# Patient Record
Sex: Male | Born: 1940 | Race: White | Hispanic: No | Marital: Married | Smoking: Former smoker
Health system: Southern US, Community
[De-identification: ages and names within clinical notes are randomized; demographics above are authoritative.]

## PROBLEM LIST (undated history)

## (undated) DIAGNOSIS — J449 Chronic obstructive pulmonary disease, unspecified: Secondary | ICD-10-CM

## (undated) DIAGNOSIS — J441 Chronic obstructive pulmonary disease with (acute) exacerbation: Secondary | ICD-10-CM

## (undated) DIAGNOSIS — E785 Hyperlipidemia, unspecified: Secondary | ICD-10-CM

## (undated) DIAGNOSIS — Z1211 Encounter for screening for malignant neoplasm of colon: Secondary | ICD-10-CM

## (undated) DIAGNOSIS — J302 Other seasonal allergic rhinitis: Secondary | ICD-10-CM

## (undated) DIAGNOSIS — N400 Enlarged prostate without lower urinary tract symptoms: Secondary | ICD-10-CM

## (undated) DIAGNOSIS — I10 Essential (primary) hypertension: Secondary | ICD-10-CM

## (undated) HISTORY — DX: Hyperlipidemia, unspecified: E78.5

## (undated) HISTORY — DX: Other seasonal allergic rhinitis: J30.2

## (undated) HISTORY — DX: Chronic obstructive pulmonary disease with (acute) exacerbation: J44.1

## (undated) HISTORY — DX: Benign prostatic hyperplasia without lower urinary tract symptoms: N40.0

## (undated) HISTORY — DX: Chronic obstructive pulmonary disease, unspecified: J44.9

## (undated) HISTORY — PX: PROSTATE SURGERY: SHX751

## (undated) HISTORY — DX: Essential (primary) hypertension: I10

## (undated) HISTORY — DX: Encounter for screening for malignant neoplasm of colon: Z12.11

---

## 1971-03-06 HISTORY — PX: GASTRIC RESECTION: SHX5248

## 2009-03-05 DIAGNOSIS — Z1211 Encounter for screening for malignant neoplasm of colon: Secondary | ICD-10-CM

## 2009-03-05 HISTORY — DX: Encounter for screening for malignant neoplasm of colon: Z12.11

## 2009-07-11 ENCOUNTER — Ambulatory Visit: Payer: Self-pay | Admitting: Gastroenterology

## 2010-05-30 LAB — BASIC METABOLIC PANEL
BUN: 21 mg/dL (ref 4–21)
Creatinine: 1.2 mg/dL (ref 0.6–1.3)
Potassium: 4.1 mmol/L (ref 3.4–5.3)
Sodium: 137 mmol/L (ref 137–147)

## 2010-05-30 LAB — TSH: TSH: 0.04 u[IU]/mL — AB (ref 0.41–5.90)

## 2010-05-30 LAB — LIPID PANEL: Triglycerides: 69 mg/dL (ref 40–160)

## 2010-05-30 LAB — CBC AND DIFFERENTIAL: Hemoglobin: 13.9 g/dL (ref 13.5–17.5)

## 2010-05-30 LAB — HEPATIC FUNCTION PANEL: Bilirubin, Total: 0.4 mg/dL

## 2010-11-30 ENCOUNTER — Encounter: Payer: Self-pay | Admitting: Internal Medicine

## 2010-11-30 ENCOUNTER — Ambulatory Visit (INDEPENDENT_AMBULATORY_CARE_PROVIDER_SITE_OTHER): Payer: Medicare Other | Admitting: Internal Medicine

## 2010-11-30 VITALS — BP 140/83 | HR 80 | Temp 98.4°F | Resp 16 | Ht 63.0 in | Wt 185.5 lb

## 2010-11-30 DIAGNOSIS — Z0000 Encounter for general adult medical examination without abnormal findings: Secondary | ICD-10-CM

## 2010-11-30 DIAGNOSIS — J40 Bronchitis, not specified as acute or chronic: Secondary | ICD-10-CM

## 2010-11-30 DIAGNOSIS — N400 Enlarged prostate without lower urinary tract symptoms: Secondary | ICD-10-CM

## 2010-11-30 DIAGNOSIS — E785 Hyperlipidemia, unspecified: Secondary | ICD-10-CM

## 2010-11-30 DIAGNOSIS — Z23 Encounter for immunization: Secondary | ICD-10-CM

## 2010-11-30 DIAGNOSIS — I10 Essential (primary) hypertension: Secondary | ICD-10-CM

## 2010-11-30 HISTORY — DX: Benign prostatic hyperplasia without lower urinary tract symptoms: N40.0

## 2010-11-30 MED ORDER — AZITHROMYCIN 250 MG PO TABS: ORAL_TABLET | ORAL | Status: AC

## 2010-11-30 MED ORDER — FINASTERIDE 5 MG PO TABS: 5.0000 mg | ORAL_TABLET | Freq: Every day | ORAL | Status: DC

## 2010-11-30 NOTE — Patient Instructions (Signed)
Start Zpack. Call if cough persists or worsens. Return visit in 6 months.

## 2010-11-30 NOTE — Progress Notes (Signed)
Subjective:    Patient ID: Lucas Black, male    DOB: Mar 15, 1940, 70 y.o.   MRN: [id number]  HPI Lucas Black is a 70 year old male with a history of hypertension and hyperlipidemia who presents for followup. He reports that he has been doing well his only complaint today is a 2 week history of nasal congestion and cough. He denies any fever or chills. He denies any shortness of breath. He reports that the cough is occasionally productive of yellow sputum. He has not had any known sick contacts.  Outpatient Encounter Prescriptions as of 11/30/2010  Medication Sig Dispense Refill  . azithromycin (ZITHROMAX Z-PAK) 250 MG tablet Take 2 tablets (500 mg) on  Day 1,  followed by 1 tablet (250 mg) once daily on Days 2 through 5.  6 each  0  . finasteride (PROSCAR) 5 MG tablet Take 1 tablet (5 mg total) by mouth daily.  90 tablet  4  . lisinopril (PRINIVIL,ZESTRIL) 10 MG tablet Take 10 mg by mouth daily.        . simvastatin (ZOCOR) 20 MG tablet Take 20 mg by mouth at bedtime.        Marland Kitchen terazosin (HYTRIN) 5 MG capsule Take 5 mg by mouth at bedtime. Takes 2 capsules at bedtime         Review of Systems  Constitutional: Negative for fever, chills, activity change and fatigue.  HENT: Positive for congestion, rhinorrhea and postnasal drip. Negative for hearing loss, ear pain, nosebleeds, sore throat, sneezing, trouble swallowing, neck pain, neck stiffness, voice change, sinus pressure, tinnitus and ear discharge.   Eyes: Negative for discharge, redness, itching and visual disturbance.  Respiratory: Positive for cough. Negative for chest tightness, shortness of breath, wheezing and stridor.   Cardiovascular: Negative for chest pain and leg swelling.  Musculoskeletal: Negative for myalgias and arthralgias.  Skin: Negative for color change and rash.  Neurological: Negative for dizziness, facial asymmetry and headaches.  Psychiatric/Behavioral: Negative for sleep disturbance.   BP 140/83  Pulse 80  Temp(Src)  98.4 F (36.9 C) (Oral)  Resp 16  Ht 5\' 3"  (1.6 m)  Wt 185 lb 8 oz (84.142 kg)  BMI 32.86 kg/m2  SpO2 96%     Objective:   Physical Exam  Constitutional: He is oriented to person, place, and time. He appears well-developed and well-nourished. No distress.  HENT:  Head: Normocephalic and atraumatic.  Right Ear: Tympanic membrane, external ear and ear canal normal.  Left Ear: External ear and ear canal normal. Tympanic membrane is erythematous.  Nose: Nose normal. Right sinus exhibits no maxillary sinus tenderness and no frontal sinus tenderness. Left sinus exhibits no maxillary sinus tenderness and no frontal sinus tenderness.  Mouth/Throat: Oropharynx is clear and moist. No oropharyngeal exudate, posterior oropharyngeal edema or posterior oropharyngeal erythema.  Eyes: Conjunctivae and EOM are normal. Pupils are equal, round, and reactive to light. Right eye exhibits no discharge. Left eye exhibits no discharge. No scleral icterus.  Neck: Normal range of motion. Neck supple. No tracheal deviation present. No thyromegaly present.  Cardiovascular: Normal rate, regular rhythm and normal heart sounds.  Exam reveals no gallop and no friction rub.   No murmur heard. Pulmonary/Chest: Effort normal and breath sounds normal. No respiratory distress. He has no wheezes. He has no rales. He exhibits no tenderness.  Musculoskeletal: Normal range of motion. He exhibits no edema.  Lymphadenopathy:    He has no cervical adenopathy.  Neurological: He is alert and oriented to person, place, and  time. No cranial nerve deficit. Coordination normal.  Skin: Skin is warm and dry. No rash noted. He is not diaphoretic. No erythema. No pallor.  Psychiatric: He has a normal mood and affect. His behavior is normal. Judgment and thought content normal.          Assessment & Plan:  1. Bronchitis - patients symptoms and clinical exam consistent with bronchitis. There is no wheezing or prolonged expiration to  suggest need for steroids. Will treat with azithromycin. He will call or return to clinic should symptoms not continue to improve over the next 48 hours.  2. Hypertension - patient with history of hypertension. Blood pressure is slightly elevated today. His dose of lisinopril was reduced by his cardiologist because of some lightheadedness at higher doses. We'll continue to monitor blood pressure for now. He will have renal function and urine microalbumin check labs today.  3. Hyperlipidemia - patient with history of hyperlipidemia. Currently on simvastatin. We'll check lipids and LFTs with labs today. He will followup in 6 months.

## 2010-12-01 ENCOUNTER — Encounter: Payer: Self-pay | Admitting: Internal Medicine

## 2010-12-01 LAB — COMPREHENSIVE METABOLIC PANEL
ALT: 15 U/L (ref 0–53)
AST: 19 U/L (ref 0–37)
Alkaline Phosphatase: 81 U/L (ref 39–117)
BUN: 19 mg/dL (ref 6–23)
Calcium: 9.7 mg/dL (ref 8.4–10.5)
Chloride: 109 mEq/L (ref 96–112)
Creatinine, Ser: 1.2 mg/dL (ref 0.4–1.5)
Total Bilirubin: 0.5 mg/dL (ref 0.3–1.2)

## 2010-12-01 LAB — LIPID PANEL
Cholesterol: 139 mg/dL (ref 0–200)
LDL Cholesterol: 73 mg/dL (ref 0–99)
Total CHOL/HDL Ratio: 3
Triglycerides: 72 mg/dL (ref 0.0–149.0)
VLDL: 14.4 mg/dL (ref 0.0–40.0)

## 2010-12-01 LAB — CBC
HCT: 41.3 % (ref 39.0–52.0)
Hemoglobin: 13.7 g/dL (ref 13.0–17.0)
MCH: 29.4 pg (ref 26.0–34.0)
MCHC: 33.2 g/dL (ref 30.0–36.0)
RBC: 4.66 MIL/uL (ref 4.22–5.81)

## 2010-12-06 LAB — HM COLONOSCOPY

## 2011-01-23 ENCOUNTER — Encounter: Payer: Self-pay | Admitting: Internal Medicine

## 2011-03-28 ENCOUNTER — Encounter: Payer: Self-pay | Admitting: Internal Medicine

## 2011-03-28 ENCOUNTER — Ambulatory Visit (INDEPENDENT_AMBULATORY_CARE_PROVIDER_SITE_OTHER): Payer: Medicare Other | Admitting: Internal Medicine

## 2011-03-28 VITALS — BP 110/60 | HR 84 | Temp 98.4°F | Ht 66.0 in | Wt 187.0 lb

## 2011-03-28 DIAGNOSIS — R062 Wheezing: Secondary | ICD-10-CM

## 2011-03-28 DIAGNOSIS — J40 Bronchitis, not specified as acute or chronic: Secondary | ICD-10-CM | POA: Diagnosis not present

## 2011-03-28 DIAGNOSIS — J440 Chronic obstructive pulmonary disease with acute lower respiratory infection: Secondary | ICD-10-CM | POA: Insufficient documentation

## 2011-03-28 MED ORDER — ALBUTEROL SULFATE HFA 108 (90 BASE) MCG/ACT IN AERS: 2.0000 | INHALATION_SPRAY | Freq: Four times a day (QID) | RESPIRATORY_TRACT | Status: DC | PRN

## 2011-03-28 MED ORDER — PREDNISONE (PAK) 10 MG PO TABS: ORAL_TABLET | ORAL | Status: AC

## 2011-03-28 MED ORDER — DOXYCYCLINE HYCLATE 100 MG PO TABS: 100.0000 mg | ORAL_TABLET | Freq: Two times a day (BID) | ORAL | Status: AC

## 2011-03-28 NOTE — Progress Notes (Signed)
Subjective:    Patient ID: Lucas Black, male    DOB: Feb 08, 1941, 71 y.o.   MRN: [id number]  HPI 71 year old male with a history of tobacco abuse presents for an acute visit complaining of several weeks of cough productive of purulent sputum, shortness of breath, and wheezing. He has been out of his albuterol inhaler and has not been using this. He has been using over-the-counter cough and cold preparations with no improvement in his symptoms. He denies any fever or chills. He denies any chest pain.  Outpatient Encounter Prescriptions as of 03/28/2011  Medication Sig Dispense Refill  . finasteride (PROSCAR) 5 MG tablet Take 1 tablet (5 mg total) by mouth daily.  90 tablet  4  . lisinopril (PRINIVIL,ZESTRIL) 10 MG tablet Take 10 mg by mouth daily.        . simvastatin (ZOCOR) 20 MG tablet Take 20 mg by mouth at bedtime.        Marland Kitchen terazosin (HYTRIN) 5 MG capsule Take 5 mg by mouth at bedtime. Takes 2 capsules at bedtime       . albuterol (PROVENTIL HFA;VENTOLIN HFA) 108 (90 BASE) MCG/ACT inhaler Inhale 2 puffs into the lungs every 6 (six) hours as needed for wheezing.  1 Inhaler  0  . doxycycline (VIBRA-TABS) 100 MG tablet Take 1 tablet (100 mg total) by mouth 2 (two) times daily.  20 tablet  0  . predniSONE (STERAPRED UNI-PAK) 10 MG tablet Take 60mg  day 1 then taper by 10mg  daily  21 tablet  0    Review of Systems  Constitutional: Negative for fever, chills, activity change and fatigue.  HENT: Negative for hearing loss, ear pain, nosebleeds, congestion, sore throat, rhinorrhea, sneezing, trouble swallowing, neck pain, neck stiffness, voice change, postnasal drip, sinus pressure, tinnitus and ear discharge.   Eyes: Negative for discharge, redness, itching and visual disturbance.  Respiratory: Positive for cough, shortness of breath and wheezing. Negative for chest tightness and stridor.   Cardiovascular: Negative for chest pain and leg swelling.  Musculoskeletal: Negative for myalgias and  arthralgias.  Skin: Negative for color change and rash.  Neurological: Negative for dizziness, facial asymmetry and headaches.  Psychiatric/Behavioral: Negative for sleep disturbance.   BP 110/60  Pulse 84  Temp(Src) 98.4 F (36.9 C) (Oral)  Ht 5\' 6"  (1.676 m)  Wt 187 lb (84.823 kg)  BMI 30.18 kg/m2  SpO2 96%     Objective:   Physical Exam  Constitutional: He is oriented to person, place, and time. He appears well-developed and well-nourished. No distress.  HENT:  Head: Normocephalic and atraumatic.  Right Ear: External ear normal.  Left Ear: External ear normal.  Nose: Nose normal.  Mouth/Throat: Oropharynx is clear and moist. No oropharyngeal exudate.  Eyes: Conjunctivae and EOM are normal. Pupils are equal, round, and reactive to light. Right eye exhibits no discharge. Left eye exhibits no discharge. No scleral icterus.  Neck: Normal range of motion. Neck supple. No tracheal deviation present. No thyromegaly present.  Cardiovascular: Normal rate, regular rhythm and normal heart sounds.  Exam reveals no gallop and no friction rub.   No murmur heard. Pulmonary/Chest: Effort normal. No accessory muscle usage. Not tachypneic. No respiratory distress. He has decreased breath sounds (prolonged exp phase). He has wheezes. He has no rales. He exhibits no tenderness.  Musculoskeletal: Normal range of motion. He exhibits no edema.  Lymphadenopathy:    He has no cervical adenopathy.  Neurological: He is alert and oriented to person, place, and time. No  cranial nerve deficit. Coordination normal.  Skin: Skin is warm and dry. No rash noted. He is not diaphoretic. No erythema. No pallor.  Psychiatric: He has a normal mood and affect. His behavior is normal. Judgment and thought content normal.          Assessment & Plan:

## 2011-03-28 NOTE — Assessment & Plan Note (Signed)
Symptoms and exam are most consistent with acute bronchitis. Will treat with doxycycline, prednisone taper pack, and inhaled albuterol. Patient will continue to use cough syrup that he has at home. He will call or return to clinic if symptoms are not improving over the next 72 hours.

## 2011-04-11 ENCOUNTER — Telehealth: Payer: Self-pay | Admitting: Internal Medicine

## 2011-04-11 ENCOUNTER — Ambulatory Visit: Payer: Self-pay | Admitting: Internal Medicine

## 2011-04-11 ENCOUNTER — Encounter: Payer: Self-pay | Admitting: Internal Medicine

## 2011-04-11 ENCOUNTER — Ambulatory Visit (INDEPENDENT_AMBULATORY_CARE_PROVIDER_SITE_OTHER): Payer: Medicare Other | Admitting: Internal Medicine

## 2011-04-11 VITALS — BP 102/62 | HR 78 | Temp 97.9°F | Ht 66.0 in | Wt 188.0 lb

## 2011-04-11 DIAGNOSIS — R062 Wheezing: Secondary | ICD-10-CM | POA: Diagnosis not present

## 2011-04-11 DIAGNOSIS — J209 Acute bronchitis, unspecified: Secondary | ICD-10-CM

## 2011-04-11 DIAGNOSIS — J40 Bronchitis, not specified as acute or chronic: Secondary | ICD-10-CM

## 2011-04-11 DIAGNOSIS — J440 Chronic obstructive pulmonary disease with acute lower respiratory infection: Secondary | ICD-10-CM | POA: Diagnosis not present

## 2011-04-11 DIAGNOSIS — R059 Cough, unspecified: Secondary | ICD-10-CM | POA: Diagnosis not present

## 2011-04-11 MED ORDER — FLUTICASONE-SALMETEROL 250-50 MCG/DOSE IN AEPB: 1.0000 | INHALATION_SPRAY | Freq: Two times a day (BID) | RESPIRATORY_TRACT | Status: DC

## 2011-04-11 MED ORDER — LEVOFLOXACIN 750 MG PO TABS: 750.0000 mg | ORAL_TABLET | Freq: Every day | ORAL | Status: DC

## 2011-04-11 MED ORDER — PREDNISONE (PAK) 10 MG PO TABS: ORAL_TABLET | ORAL | Status: DC

## 2011-04-11 MED ORDER — METHYLPREDNISOLONE ACETATE PF 40 MG/ML IJ SUSP
40.0000 mg | Freq: Once | INTRAMUSCULAR | Status: AC
  Administered 2011-04-11: 40 mg via INTRAMUSCULAR

## 2011-04-11 NOTE — Assessment & Plan Note (Signed)
Symptoms are most consistent with COPD with acute bronchitis. Will repeat antibiotics and prednisone taper. Will get chest x-ray today. Will give injection of 40 mg of Depo-Medrol. Patient will also start Advair inhaled twice daily. He'll continue to use albuterol as needed. He will followup for recheck in one week. He will call sooner if symptoms are not improving.

## 2011-04-11 NOTE — Progress Notes (Signed)
Subjective:    Patient ID: Lucas Black, male    DOB: 1940/12/02, 71 y.o.   MRN: [id number]  HPI 71 year old male presents for followup visit after recent episode of bronchitis. He was initially treated with doxycycline and prednisone and notes some improvement with this, however he continues to have wheezing, shortness of breath, and cough productive of thick sputum. He denies any chest pain. He notes some fatigue. He denies any fever or chills. Of note, he is a former smoker. He has never been diagnosed with COPD, however has had several episodes of bronchitis each year.  Outpatient Encounter Prescriptions as of 04/11/2011  Medication Sig Dispense Refill  . albuterol (PROVENTIL HFA;VENTOLIN HFA) 108 (90 BASE) MCG/ACT inhaler Inhale 2 puffs into the lungs every 6 (six) hours as needed for wheezing.  1 Inhaler  0  . finasteride (PROSCAR) 5 MG tablet Take 1 tablet (5 mg total) by mouth daily.  90 tablet  4  . lisinopril (PRINIVIL,ZESTRIL) 10 MG tablet Take 10 mg by mouth daily.        . simvastatin (ZOCOR) 20 MG tablet Take 20 mg by mouth at bedtime.        Marland Kitchen terazosin (HYTRIN) 5 MG capsule Take 5 mg by mouth at bedtime. Takes 2 capsules at bedtime       . Fluticasone-Salmeterol (ADVAIR DISKUS) 250-50 MCG/DOSE AEPB Inhale 1 puff into the lungs 2 (two) times daily.  1 each  3  . levofloxacin (LEVAQUIN) 750 MG tablet Take 1 tablet (750 mg total) by mouth daily.  7 tablet  0  . predniSONE (STERAPRED UNI-PAK) 10 MG tablet Take 60mg  day 1 then taper by 10mg  daily  21 tablet  0   Facility-Administered Encounter Medications as of 04/11/2011  Medication Dose Route Frequency Provider Last Rate Last Dose  . methylPREDNISolone acetate PF (DEPO-MEDROL) injection 40 mg  40 mg Intramuscular Once Shelia Media, MD   40 mg at 04/11/11 1301    Review of Systems  Constitutional: Positive for fatigue. Negative for fever, chills and activity change.  HENT: Negative for hearing loss, ear pain, nosebleeds,  congestion, sore throat, rhinorrhea, sneezing, trouble swallowing, neck pain, neck stiffness, voice change, postnasal drip, sinus pressure, tinnitus and ear discharge.   Eyes: Negative for discharge, redness, itching and visual disturbance.  Respiratory: Positive for cough, shortness of breath and wheezing. Negative for chest tightness and stridor.   Cardiovascular: Negative for chest pain and leg swelling.  Musculoskeletal: Negative for myalgias and arthralgias.  Skin: Negative for color change and rash.  Neurological: Negative for dizziness, facial asymmetry and headaches.  Psychiatric/Behavioral: Negative for sleep disturbance.   BP 102/62  Pulse 78  Temp(Src) 97.9 F (36.6 C) (Oral)  Ht 5\' 6"  (1.676 m)  Wt 188 lb (85.276 kg)  BMI 30.34 kg/m2  SpO2 97%     Objective:   Physical Exam  Constitutional: He is oriented to person, place, and time. He appears well-developed and well-nourished. No distress.  HENT:  Head: Normocephalic and atraumatic.  Right Ear: External ear normal.  Left Ear: External ear normal.  Nose: Nose normal.  Mouth/Throat: Oropharynx is clear and moist. No oropharyngeal exudate.  Eyes: Conjunctivae and EOM are normal. Pupils are equal, round, and reactive to light. Right eye exhibits no discharge. Left eye exhibits no discharge. No scleral icterus.  Neck: Normal range of motion. Neck supple. No tracheal deviation present. No thyromegaly present.  Cardiovascular: Normal rate, regular rhythm and normal heart sounds.  Exam reveals  no gallop and no friction rub.   No murmur heard. Pulmonary/Chest: Effort normal. No accessory muscle usage. Not tachypneic. No respiratory distress. He has decreased breath sounds. He has wheezes. He has rhonchi in the right lower field. He has no rales. He exhibits no tenderness.  Musculoskeletal: Normal range of motion. He exhibits no edema.  Lymphadenopathy:    He has no cervical adenopathy.  Neurological: He is alert and oriented  to person, place, and time. No cranial nerve deficit. Coordination normal.  Skin: Skin is warm and dry. No rash noted. He is not diaphoretic. No erythema. No pallor.  Psychiatric: He has a normal mood and affect. His behavior is normal. Judgment and thought content normal.          Assessment & Plan:

## 2011-04-11 NOTE — Telephone Encounter (Signed)
CXR was normal. Continue antibiotics and steroids as prescribed.

## 2011-04-11 NOTE — Telephone Encounter (Signed)
[facility] Triage Call Report Triage Record Num: [id number] Operator: Chevis Pretty Patient Name: Lucas Black Call Date & Time: 04/11/2011 8:46:04AM Patient Phone: [phone] PCP: Patient Gender: Male PCP Fax : Patient DOB: 12-04-1940 Practice Name: [facility] Reason for Call: Caller: Carol/Spouse; PCP: Ronna Polio; CB#: [phone]; ; ; Call regarding Cough/Congestion; onset 2 weeks ago. Seen in office and placed on prednisone, antibiotics. Finished that. Continues to have cough and wheezing. Using inhaler BID. Coughing is worse. Has not used albuterol q 4 hours or for rescue/cough control. Per protocol, advised appt wihtin 4 hours; appt sched 04/11/11 1115 with Dr. Dan Humphreys. Protocol(s) Used: Asthma - Adult Recommended Outcome per Protocol: See Provider within 4 hours Reason for Outcome: New onset or worsening cough AND asthma with increasing frequency of flare-ups since last scheduled appointment Care Advice: ~ Another adult should drive. Limit or avoid exposure to irritants and allergens (e.g. air pollution, smoke/smoking, chemicals, dust, pollen, pet dander, etc.) ~ ~ Call provider if symptoms worsen or new symptoms develop. If patient has had similar symptoms in past, relieved by provider's recommendations, follow those recommendations now. ~ ~ Avoid any activity that produces symptoms until evaluated by provider. Call EMS 911 if sudden worsening of breathing problems, struggling to breathe, high pitched noise when breathing in (stridor), unable to speak, grasping at throat, or panic/anxiety because of breathing problems. ~ ~ Use prescribed rescue medication (inhaler, nebulizer) as directed. ~ SYMPTOM / CONDITION MANAGEMENT ~ CAUTIONS ~ List, or take, all current prescription(s), nonprescription or alternative medication(s) to provider for evaluation. 04/11/2011 8:55:44AM Page 1 of 1 [email or web address]

## 2011-04-26 ENCOUNTER — Ambulatory Visit (INDEPENDENT_AMBULATORY_CARE_PROVIDER_SITE_OTHER): Payer: Medicare Other | Admitting: Internal Medicine

## 2011-04-26 ENCOUNTER — Encounter: Payer: Self-pay | Admitting: Internal Medicine

## 2011-04-26 VITALS — HR 69 | Temp 98.2°F | Resp 10

## 2011-04-26 DIAGNOSIS — E785 Hyperlipidemia, unspecified: Secondary | ICD-10-CM | POA: Diagnosis not present

## 2011-04-26 DIAGNOSIS — J441 Chronic obstructive pulmonary disease with (acute) exacerbation: Secondary | ICD-10-CM

## 2011-04-26 DIAGNOSIS — K259 Gastric ulcer, unspecified as acute or chronic, without hemorrhage or perforation: Secondary | ICD-10-CM | POA: Insufficient documentation

## 2011-04-26 MED ORDER — BUDESONIDE-FORMOTEROL FUMARATE 160-4.5 MCG/ACT IN AERO: 2.0000 | INHALATION_SPRAY | Freq: Two times a day (BID) | RESPIRATORY_TRACT | Status: DC

## 2011-04-26 MED ORDER — PREDNISONE 10 MG PO TABS: ORAL_TABLET | ORAL | Status: DC

## 2011-04-26 MED ORDER — SIMVASTATIN 20 MG PO TABS: 20.0000 mg | ORAL_TABLET | Freq: Every day | ORAL | Status: DC

## 2011-04-26 NOTE — Assessment & Plan Note (Signed)
Symptoms consistent with COPD exacerbation. Bronchitis initially improved with use of prednisone taper and antibiotics, however have recurred after he stopped inhaled bronchodilator and steroids. Provided him samples with Symbicort. We'll continue to use albuterol as needed. Will repeat prednisone taper, but prolong taper this time. He will followup in 2 weeks.

## 2011-04-26 NOTE — Progress Notes (Signed)
Subjective:    Patient ID: Lucas Black, male    DOB: 12-21-1940, 71 y.o.   MRN: [id number]  HPI 70YO male with h/o COPD presents for follow up.  He was recently treated with antibiotics and prednisone taper x2 for acute bronchitis. CXR performed 04/11/2011 was normal.  He was also started on inhaled Advair but reports he ran out of this and stopped medication. He notes that symptoms initially improved, but wheezing and shortness of breath have recurred and been persistent. He denies any productive cough. He denies any fever or chills. He denies any chest pain.  Outpatient Encounter Prescriptions as of 04/26/2011  Medication Sig Dispense Refill  . albuterol (PROVENTIL HFA;VENTOLIN HFA) 108 (90 BASE) MCG/ACT inhaler Inhale 2 puffs into the lungs every 6 (six) hours as needed for wheezing.  1 Inhaler  0  . budesonide-formoterol (SYMBICORT) 160-4.5 MCG/ACT inhaler Inhale 2 puffs into the lungs 2 (two) times daily.  1 Inhaler  3  . finasteride (PROSCAR) 5 MG tablet Take 1 tablet (5 mg total) by mouth daily.  90 tablet  4  . lisinopril (PRINIVIL,ZESTRIL) 10 MG tablet Take 10 mg by mouth daily.        . predniSONE (DELTASONE) 10 MG tablet Take 40mg  daily x 3 days, then 30mg  daily x3 days, then 20mg  daily x 3 days, then 10mg  daily x 3 days, then stop.  30 tablet  0  . simvastatin (ZOCOR) 20 MG tablet Take 1 tablet (20 mg total) by mouth at bedtime.  30 tablet  6  . terazosin (HYTRIN) 5 MG capsule Take 5 mg by mouth at bedtime. Takes 2 capsules at bedtime       . DISCONTD: Fluticasone-Salmeterol (ADVAIR DISKUS) 250-50 MCG/DOSE AEPB Inhale 1 puff into the lungs 2 (two) times daily.  1 each  3  . DISCONTD: simvastatin (ZOCOR) 20 MG tablet Take 20 mg by mouth at bedtime.         Review of Systems  Constitutional: Negative for fever, chills, activity change and fatigue.  HENT: Negative for hearing loss, ear pain, nosebleeds, congestion, sore throat, rhinorrhea, sneezing, trouble swallowing, neck pain, neck  stiffness, voice change, postnasal drip, sinus pressure, tinnitus and ear discharge.   Eyes: Negative for discharge, redness, itching and visual disturbance.  Respiratory: Positive for cough, shortness of breath and wheezing. Negative for chest tightness and stridor.   Cardiovascular: Negative for chest pain and leg swelling.  Musculoskeletal: Negative for myalgias and arthralgias.  Skin: Negative for color change and rash.  Neurological: Negative for dizziness, facial asymmetry and headaches.  Psychiatric/Behavioral: Negative for sleep disturbance.   Pulse 69  Temp 98.2 F (36.8 C)  Resp 10  SpO2 97%     Objective:   Physical Exam  Constitutional: He is oriented to person, place, and time. He appears well-developed and well-nourished. No distress.  HENT:  Head: Normocephalic and atraumatic.  Right Ear: External ear normal.  Left Ear: External ear normal.  Nose: Nose normal.  Mouth/Throat: Oropharynx is clear and moist. No oropharyngeal exudate.  Eyes: Conjunctivae and EOM are normal. Pupils are equal, round, and reactive to light. Right eye exhibits no discharge. Left eye exhibits no discharge. No scleral icterus.  Neck: Normal range of motion. Neck supple. No tracheal deviation present. No thyromegaly present.  Cardiovascular: Normal rate, regular rhythm and normal heart sounds.  Exam reveals no gallop and no friction rub.   No murmur heard. Pulmonary/Chest: Effort normal. No accessory muscle usage. Not tachypneic. No respiratory  distress. He has decreased breath sounds (prolonged expiration). He has wheezes. He has no rhonchi. He has no rales. He exhibits no tenderness.  Musculoskeletal: Normal range of motion. He exhibits no edema.  Lymphadenopathy:    He has no cervical adenopathy.  Neurological: He is alert and oriented to person, place, and time. No cranial nerve deficit. Coordination normal.  Skin: Skin is warm and dry. No rash noted. He is not diaphoretic. No erythema. No  pallor.  Psychiatric: He has a normal mood and affect. His behavior is normal. Judgment and thought content normal.          Assessment & Plan:

## 2011-05-09 ENCOUNTER — Encounter: Payer: Self-pay | Admitting: Internal Medicine

## 2011-05-10 ENCOUNTER — Encounter: Payer: Self-pay | Admitting: Internal Medicine

## 2011-05-30 ENCOUNTER — Ambulatory Visit (INDEPENDENT_AMBULATORY_CARE_PROVIDER_SITE_OTHER): Payer: Medicare Other | Admitting: Internal Medicine

## 2011-05-30 ENCOUNTER — Encounter: Payer: Self-pay | Admitting: Internal Medicine

## 2011-05-30 VITALS — BP 128/68 | HR 84 | Temp 98.2°F | Ht 66.0 in | Wt 190.0 lb

## 2011-05-30 DIAGNOSIS — E785 Hyperlipidemia, unspecified: Secondary | ICD-10-CM | POA: Diagnosis not present

## 2011-05-30 DIAGNOSIS — J449 Chronic obstructive pulmonary disease, unspecified: Secondary | ICD-10-CM | POA: Diagnosis not present

## 2011-05-30 DIAGNOSIS — J441 Chronic obstructive pulmonary disease with (acute) exacerbation: Secondary | ICD-10-CM | POA: Diagnosis not present

## 2011-05-30 DIAGNOSIS — I10 Essential (primary) hypertension: Secondary | ICD-10-CM

## 2011-05-30 HISTORY — DX: Chronic obstructive pulmonary disease, unspecified: J44.9

## 2011-05-30 LAB — COMPREHENSIVE METABOLIC PANEL
ALT: 19 U/L (ref 0–53)
CO2: 26 mEq/L (ref 19–32)
Calcium: 9.4 mg/dL (ref 8.4–10.5)
Chloride: 103 mEq/L (ref 96–112)
GFR: 68.57 mL/min (ref >60.00)
Glucose, Bld: 86 mg/dL (ref 70–99)
Sodium: 137 mEq/L (ref 135–145)
Total Bilirubin: 0.6 mg/dL (ref 0.3–1.2)
Total Protein: 6.9 g/dL (ref 6.0–8.3)

## 2011-05-30 MED ORDER — BUDESONIDE-FORMOTEROL FUMARATE 160-4.5 MCG/ACT IN AERO: 2.0000 | INHALATION_SPRAY | Freq: Two times a day (BID) | RESPIRATORY_TRACT | Status: DC

## 2011-05-30 MED ORDER — SIMVASTATIN 20 MG PO TABS: 20.0000 mg | ORAL_TABLET | Freq: Every day | ORAL | Status: DC

## 2011-05-30 MED ORDER — LISINOPRIL 10 MG PO TABS: 10.0000 mg | ORAL_TABLET | Freq: Every day | ORAL | Status: DC

## 2011-05-30 NOTE — Assessment & Plan Note (Signed)
Will check LFTs with labs today. Continue Simvastatin. Last LDL at goal <100. Follow up 6 months.

## 2011-05-30 NOTE — Assessment & Plan Note (Signed)
BP well controlled today. Will check renal function with labs. Continue Lisinopril. Follow up 6 months.

## 2011-05-30 NOTE — Assessment & Plan Note (Signed)
Symptoms of recent exacerbation have resolved. Doing well. Exam normal today. Will continue Symbicort. Follow up 6 months.

## 2011-05-30 NOTE — Progress Notes (Signed)
Subjective:    Patient ID: Lucas Black, male    DOB: 05-21-1940, 71 y.o.   MRN: [id number]  HPI 70YO male with h/o COPD, HTN, HL presents for follow up after recent COPD exacerbation. He reports breathing is much improved. No further shortness of breath, chest pain, wheezing, fever or chills. Continues on Symbicort twice daily. Has not recently needed rescue albuterol.  No new concerns today.  In regards to HTN and HL, notes full compliance with meds. No noted side effects from medications.  Outpatient Encounter Prescriptions as of 05/30/2011  Medication Sig Dispense Refill  . albuterol (PROVENTIL HFA;VENTOLIN HFA) 108 (90 BASE) MCG/ACT inhaler Inhale 2 puffs into the lungs every 6 (six) hours as needed for wheezing.  1 Inhaler  0  . budesonide-formoterol (SYMBICORT) 160-4.5 MCG/ACT inhaler Inhale 2 puffs into the lungs 2 (two) times daily.  3 Inhaler  3  . finasteride (PROSCAR) 5 MG tablet Take 1 tablet (5 mg total) by mouth daily.  90 tablet  4  . lisinopril (PRINIVIL,ZESTRIL) 10 MG tablet Take 1 tablet (10 mg total) by mouth daily.  90 tablet  1  . predniSONE (DELTASONE) 10 MG tablet Take 40mg  daily x 3 days, then 30mg  daily x3 days, then 20mg  daily x 3 days, then 10mg  daily x 3 days, then stop.  30 tablet  0  . simvastatin (ZOCOR) 20 MG tablet Take 1 tablet (20 mg total) by mouth at bedtime.  90 tablet  1  . terazosin (HYTRIN) 5 MG capsule Take 5 mg by mouth at bedtime. Takes 2 capsules at bedtime       . DISCONTD: budesonide-formoterol (SYMBICORT) 160-4.5 MCG/ACT inhaler Inhale 2 puffs into the lungs 2 (two) times daily.  1 Inhaler  3  . DISCONTD: lisinopril (PRINIVIL,ZESTRIL) 10 MG tablet Take 10 mg by mouth daily.        Marland Kitchen DISCONTD: simvastatin (ZOCOR) 20 MG tablet Take 1 tablet (20 mg total) by mouth at bedtime.  30 tablet  6    Review of Systems  Constitutional: Negative for fever, chills, activity change, appetite change, fatigue and unexpected weight change.  Eyes: Negative for  visual disturbance.  Respiratory: Negative for cough and shortness of breath.   Cardiovascular: Negative for chest pain, palpitations and leg swelling.  Gastrointestinal: Negative for abdominal pain and abdominal distention.  Genitourinary: Negative for dysuria, urgency and difficulty urinating.  Musculoskeletal: Negative for arthralgias and gait problem.  Skin: Negative for color change and rash.  Hematological: Negative for adenopathy.  Psychiatric/Behavioral: Negative for sleep disturbance and dysphoric mood. The patient is not nervous/anxious.        Objective:   Physical Exam  Constitutional: He is oriented to person, place, and time. He appears well-developed and well-nourished. No distress.  HENT:  Head: Normocephalic and atraumatic.  Right Ear: External ear normal.  Left Ear: External ear normal.  Nose: Nose normal.  Mouth/Throat: Oropharynx is clear and moist. No oropharyngeal exudate.  Eyes: Conjunctivae and EOM are normal. Pupils are equal, round, and reactive to light. Right eye exhibits no discharge. Left eye exhibits no discharge. No scleral icterus.  Neck: Normal range of motion. Neck supple. No tracheal deviation present. No thyromegaly present.  Cardiovascular: Normal rate, regular rhythm and normal heart sounds.  Exam reveals no gallop and no friction rub.   No murmur heard. Pulmonary/Chest: Effort normal and breath sounds normal. No respiratory distress. He has no wheezes. He has no rales. He exhibits no tenderness.  Musculoskeletal: Normal range  of motion. He exhibits no edema.  Lymphadenopathy:    He has no cervical adenopathy.  Neurological: He is alert and oriented to person, place, and time. No cranial nerve deficit. Coordination normal.  Skin: Skin is warm and dry. No rash noted. He is not diaphoretic. No erythema. No pallor.  Psychiatric: He has a normal mood and affect. His behavior is normal. Judgment and thought content normal.          Assessment &  Plan:

## 2011-06-26 DIAGNOSIS — E785 Hyperlipidemia, unspecified: Secondary | ICD-10-CM | POA: Diagnosis not present

## 2011-06-26 DIAGNOSIS — R0602 Shortness of breath: Secondary | ICD-10-CM | POA: Diagnosis not present

## 2011-06-26 DIAGNOSIS — I059 Rheumatic mitral valve disease, unspecified: Secondary | ICD-10-CM | POA: Diagnosis not present

## 2011-06-26 DIAGNOSIS — I10 Essential (primary) hypertension: Secondary | ICD-10-CM | POA: Diagnosis not present

## 2011-06-27 DIAGNOSIS — R011 Cardiac murmur, unspecified: Secondary | ICD-10-CM | POA: Diagnosis not present

## 2011-08-27 ENCOUNTER — Encounter: Payer: Self-pay | Admitting: Internal Medicine

## 2011-08-28 ENCOUNTER — Ambulatory Visit (INDEPENDENT_AMBULATORY_CARE_PROVIDER_SITE_OTHER): Payer: Medicare Other | Admitting: Internal Medicine

## 2011-08-28 ENCOUNTER — Encounter: Payer: Self-pay | Admitting: Internal Medicine

## 2011-08-28 VITALS — BP 142/90 | HR 72 | Temp 98.4°F | Ht 66.0 in | Wt 189.2 lb

## 2011-08-28 DIAGNOSIS — J40 Bronchitis, not specified as acute or chronic: Secondary | ICD-10-CM | POA: Diagnosis not present

## 2011-08-28 MED ORDER — PREDNISONE (PAK) 10 MG PO TABS: ORAL_TABLET | ORAL | Status: AC

## 2011-08-28 MED ORDER — LEVOFLOXACIN 750 MG PO TABS: 750.0000 mg | ORAL_TABLET | Freq: Every day | ORAL | Status: AC

## 2011-08-28 NOTE — Progress Notes (Signed)
Subjective:    Patient ID: Lucas Black, male    DOB: 18-Nov-1940, 71 y.o.   MRN: [id number]  HPI 71 year old male with history of COPD presents for acute visit complaining of 2 week history of cough productive of purulent sputum, shortness of breath. He denies any fever, chills, chest pain. He notes several sick contacts in his grandchildren. He has been using over-the-counter cough and cold preparations with no improvement. He reports full compliance with his Advair and albuterol with no improvement.  Outpatient Encounter Prescriptions as of 08/28/2011  Medication Sig Dispense Refill  . albuterol (PROVENTIL HFA;VENTOLIN HFA) 108 (90 BASE) MCG/ACT inhaler Inhale 2 puffs into the lungs every 6 (six) hours as needed for wheezing.  1 Inhaler  0  . budesonide-formoterol (SYMBICORT) 160-4.5 MCG/ACT inhaler Inhale 2 puffs into the lungs 2 (two) times daily.  3 Inhaler  3  . finasteride (PROSCAR) 5 MG tablet Take 1 tablet (5 mg total) by mouth daily.  90 tablet  4  . lisinopril (PRINIVIL,ZESTRIL) 10 MG tablet Take 1 tablet (10 mg total) by mouth daily.  90 tablet  1  . predniSONE (DELTASONE) 10 MG tablet Take 40mg  daily x 3 days, then 30mg  daily x3 days, then 20mg  daily x 3 days, then 10mg  daily x 3 days, then stop.  30 tablet  0  . simvastatin (ZOCOR) 20 MG tablet Take 1 tablet (20 mg total) by mouth at bedtime.  90 tablet  1  . terazosin (HYTRIN) 5 MG capsule Take 5 mg by mouth at bedtime. Takes 2 capsules at bedtime       . levofloxacin (LEVAQUIN) 750 MG tablet Take 1 tablet (750 mg total) by mouth daily.  7 tablet  0  . predniSONE (STERAPRED UNI-PAK) 10 MG tablet Take 60mg  day 1 then taper by 10mg  daily  21 tablet  0   BP 142/90  Pulse 72  Temp 98.4 F (36.9 C) (Oral)  Ht 5\' 6"  (1.676 m)  Wt 189 lb 4 oz (85.843 kg)  BMI 30.55 kg/m2  SpO2 97%  Review of Systems  Constitutional: Negative for fever, chills, activity change and fatigue.  HENT: Negative for hearing loss, ear pain, nosebleeds,  congestion, sore throat, rhinorrhea, sneezing, trouble swallowing, neck pain, neck stiffness, voice change, postnasal drip, sinus pressure, tinnitus and ear discharge.   Eyes: Negative for discharge, redness, itching and visual disturbance.  Respiratory: Positive for cough and shortness of breath. Negative for chest tightness, wheezing and stridor.   Cardiovascular: Negative for chest pain and leg swelling.  Musculoskeletal: Negative for myalgias and arthralgias.  Skin: Negative for color change and rash.  Neurological: Negative for dizziness, facial asymmetry and headaches.  Psychiatric/Behavioral: Negative for disturbed wake/sleep cycle.       Objective:   Physical Exam  Constitutional: He is oriented to person, place, and time. He appears well-developed and well-nourished. No distress.  HENT:  Head: Normocephalic and atraumatic.  Right Ear: External ear normal.  Left Ear: External ear normal.  Nose: Nose normal.  Mouth/Throat: Oropharynx is clear and moist. No oropharyngeal exudate.  Eyes: Conjunctivae and EOM are normal. Pupils are equal, round, and reactive to light. Right eye exhibits no discharge. Left eye exhibits no discharge. No scleral icterus.  Neck: Normal range of motion. Neck supple. No tracheal deviation present. No thyromegaly present.  Cardiovascular: Normal rate, regular rhythm and normal heart sounds.  Exam reveals no gallop and no friction rub.   No murmur heard. Pulmonary/Chest: Effort normal. No accessory muscle  usage. Not tachypneic. No respiratory distress. He has no decreased breath sounds. He has wheezes in the right middle field and the right lower field. He has rhonchi in the right lower field. He has no rales. He exhibits no tenderness.  Musculoskeletal: Normal range of motion. He exhibits no edema.  Lymphadenopathy:    He has no cervical adenopathy.  Neurological: He is alert and oriented to person, place, and time. No cranial nerve deficit. Coordination  normal.  Skin: Skin is warm and dry. No rash noted. He is not diaphoretic. No erythema. No pallor.  Psychiatric: He has a normal mood and affect. His behavior is normal. Judgment and thought content normal.          Assessment & Plan:

## 2011-08-28 NOTE — Patient Instructions (Signed)
Call or email if symptoms not better by Thursday

## 2011-08-28 NOTE — Assessment & Plan Note (Signed)
Symptoms and exam consistent with acute bronchitis. Will treat with Levaquin and prednisone taper. Patient will continue his albuterol as needed and Advair daily. He will followup if symptoms are not improving over the next 72 hours.

## 2011-11-15 ENCOUNTER — Encounter: Payer: Self-pay | Admitting: Internal Medicine

## 2011-11-16 ENCOUNTER — Ambulatory Visit (INDEPENDENT_AMBULATORY_CARE_PROVIDER_SITE_OTHER): Payer: Medicare Other | Admitting: Internal Medicine

## 2011-11-16 ENCOUNTER — Encounter: Payer: Self-pay | Admitting: Internal Medicine

## 2011-11-16 VITALS — BP 140/74 | HR 68 | Temp 98.3°F | Ht 66.0 in | Wt 190.2 lb

## 2011-11-16 DIAGNOSIS — J441 Chronic obstructive pulmonary disease with (acute) exacerbation: Secondary | ICD-10-CM

## 2011-11-16 DIAGNOSIS — J40 Bronchitis, not specified as acute or chronic: Secondary | ICD-10-CM | POA: Diagnosis not present

## 2011-11-16 DIAGNOSIS — I10 Essential (primary) hypertension: Secondary | ICD-10-CM

## 2011-11-16 DIAGNOSIS — N400 Enlarged prostate without lower urinary tract symptoms: Secondary | ICD-10-CM

## 2011-11-16 DIAGNOSIS — E785 Hyperlipidemia, unspecified: Secondary | ICD-10-CM

## 2011-11-16 MED ORDER — SIMVASTATIN 20 MG PO TABS: 20.0000 mg | ORAL_TABLET | Freq: Every day | ORAL | Status: DC

## 2011-11-16 MED ORDER — LISINOPRIL 10 MG PO TABS: 10.0000 mg | ORAL_TABLET | Freq: Every day | ORAL | Status: DC

## 2011-11-16 MED ORDER — TERAZOSIN HCL 5 MG PO CAPS: 5.0000 mg | ORAL_CAPSULE | Freq: Every day | ORAL | Status: DC

## 2011-11-16 MED ORDER — HYDROCOD POLST-CHLORPHEN POLST 10-8 MG/5ML PO LQCR: 5.0000 mL | Freq: Two times a day (BID) | ORAL | Status: DC | PRN

## 2011-11-16 MED ORDER — BUDESONIDE-FORMOTEROL FUMARATE 160-4.5 MCG/ACT IN AERO: 2.0000 | INHALATION_SPRAY | Freq: Two times a day (BID) | RESPIRATORY_TRACT | Status: DC

## 2011-11-16 MED ORDER — PREDNISONE (PAK) 10 MG PO TABS: ORAL_TABLET | ORAL | Status: AC

## 2011-11-16 MED ORDER — FINASTERIDE 5 MG PO TABS: 5.0000 mg | ORAL_TABLET | Freq: Every day | ORAL | Status: DC

## 2011-11-16 MED ORDER — ALBUTEROL SULFATE HFA 108 (90 BASE) MCG/ACT IN AERS: 2.0000 | INHALATION_SPRAY | Freq: Four times a day (QID) | RESPIRATORY_TRACT | Status: DC | PRN

## 2011-11-16 MED ORDER — LEVOFLOXACIN 500 MG PO TABS: 500.0000 mg | ORAL_TABLET | Freq: Every day | ORAL | Status: AC

## 2011-11-16 NOTE — Assessment & Plan Note (Signed)
Symptoms and exam are consistent with bronchitis. Will treat with prednisone taper and Levaquin. Patient will continue Symbicort and will add albuterol as needed for wheezing or cough. He will followup in 2 weeks or call sooner if symptoms are not improving.

## 2011-11-16 NOTE — Progress Notes (Signed)
Subjective:    Patient ID: Lucas Black, male    DOB: 1940-11-04, 71 y.o.   MRN: [id number]  HPI 71 year old male with history of COPD, hypertension, hyperlipidemia presents for acute visit complaining of 2 week history of cough which is nonproductive, shortness of breath, wheezing, nasal congestion, and left ear pain. He has been using his inhalers including Symbicort and albuterol with no improvement. He denies any fever or chills. He denies chest pain.  Outpatient Encounter Prescriptions as of 11/16/2011  Medication Sig Dispense Refill  . albuterol (PROVENTIL HFA;VENTOLIN HFA) 108 (90 BASE) MCG/ACT inhaler Inhale 2 puffs into the lungs every 6 (six) hours as needed for wheezing.  3 Inhaler  4  . budesonide-formoterol (SYMBICORT) 160-4.5 MCG/ACT inhaler Inhale 2 puffs into the lungs 2 (two) times daily.  3 Inhaler  4  . finasteride (PROSCAR) 5 MG tablet Take 1 tablet (5 mg total) by mouth daily.  90 tablet  3  . lisinopril (PRINIVIL,ZESTRIL) 10 MG tablet Take 1 tablet (10 mg total) by mouth daily.  90 tablet  3  . simvastatin (ZOCOR) 20 MG tablet Take 1 tablet (20 mg total) by mouth at bedtime.  90 tablet  3  . terazosin (HYTRIN) 5 MG capsule Take 1 capsule (5 mg total) by mouth at bedtime.  90 capsule  3  . chlorpheniramine-HYDROcodone (TUSSIONEX) 10-8 MG/5ML LQCR Take 5 mLs by mouth every 12 (twelve) hours as needed.  140 mL  1  . levofloxacin (LEVAQUIN) 500 MG tablet Take 1 tablet (500 mg total) by mouth daily.  7 tablet  0  . predniSONE (STERAPRED UNI-PAK) 10 MG tablet Take 60mg  day 1 then taper by 10mg  daily  21 tablet  0  . DISCONTD: predniSONE (DELTASONE) 10 MG tablet Take 40mg  daily x 3 days, then 30mg  daily x3 days, then 20mg  daily x 3 days, then 10mg  daily x 3 days, then stop.  30 tablet  0   BP 140/74  Pulse 68  Temp 98.3 F (36.8 C) (Oral)  Ht 5\' 6"  (1.676 m)  Wt 190 lb 4 oz (86.297 kg)  BMI 30.71 kg/m2  SpO2 97%  Review of Systems  Constitutional: Positive for fatigue.  Negative for fever, chills and activity change.  HENT: Positive for ear pain, congestion, rhinorrhea, postnasal drip and sinus pressure. Negative for hearing loss, nosebleeds, sore throat, sneezing, trouble swallowing, neck pain, neck stiffness, voice change, tinnitus and ear discharge.   Eyes: Negative for discharge, redness, itching and visual disturbance.  Respiratory: Positive for cough, shortness of breath and wheezing. Negative for chest tightness and stridor.   Cardiovascular: Negative for chest pain and leg swelling.  Musculoskeletal: Negative for myalgias and arthralgias.  Skin: Negative for color change and rash.  Neurological: Negative for dizziness, facial asymmetry and headaches.  Psychiatric/Behavioral: Negative for disturbed wake/sleep cycle.       Objective:   Physical Exam  Constitutional: He is oriented to person, place, and time. He appears well-developed and well-nourished. No distress.  HENT:  Head: Normocephalic and atraumatic.  Right Ear: Tympanic membrane and external ear normal.  Left Ear: External ear normal. Tympanic membrane is erythematous and bulging. A middle ear effusion is present.  Nose: Nose normal.  Mouth/Throat: Oropharynx is clear and moist. No oropharyngeal exudate.  Eyes: Conjunctivae normal and EOM are normal. Pupils are equal, round, and reactive to light. Right eye exhibits no discharge. Left eye exhibits no discharge. No scleral icterus.  Neck: Normal range of motion. Neck supple. No tracheal  deviation present. No thyromegaly present.  Cardiovascular: Normal rate, regular rhythm and normal heart sounds.  Exam reveals no gallop and no friction rub.   No murmur heard. Pulmonary/Chest: Effort normal. No accessory muscle usage. Not tachypneic. No respiratory distress. He has no decreased breath sounds. He has wheezes. He has rhonchi (throughout). He has no rales. He exhibits no tenderness.  Musculoskeletal: Normal range of motion. He exhibits no  edema.  Lymphadenopathy:    He has no cervical adenopathy.  Neurological: He is alert and oriented to person, place, and time. No cranial nerve deficit. Coordination normal.  Skin: Skin is warm and dry. No rash noted. He is not diaphoretic. No erythema. No pallor.  Psychiatric: He has a normal mood and affect. His behavior is normal. Judgment and thought content normal.          Assessment & Plan:

## 2011-12-06 ENCOUNTER — Encounter: Payer: Self-pay | Admitting: Internal Medicine

## 2011-12-06 ENCOUNTER — Ambulatory Visit (INDEPENDENT_AMBULATORY_CARE_PROVIDER_SITE_OTHER): Payer: Medicare Other | Admitting: Internal Medicine

## 2011-12-06 VITALS — BP 130/90 | HR 63 | Temp 97.9°F | Ht 66.0 in | Wt 188.0 lb

## 2011-12-06 DIAGNOSIS — E785 Hyperlipidemia, unspecified: Secondary | ICD-10-CM

## 2011-12-06 DIAGNOSIS — Z125 Encounter for screening for malignant neoplasm of prostate: Secondary | ICD-10-CM | POA: Insufficient documentation

## 2011-12-06 DIAGNOSIS — J309 Allergic rhinitis, unspecified: Secondary | ICD-10-CM | POA: Diagnosis not present

## 2011-12-06 DIAGNOSIS — Z0000 Encounter for general adult medical examination without abnormal findings: Secondary | ICD-10-CM

## 2011-12-06 NOTE — Assessment & Plan Note (Signed)
General exam normal today. EKG normal.  Health maintenance is UTD except for flu shot which was given. Will check labs including CBC, CMP, lipids, PSA.  Paperwork given in regards to [facility] and living will. Follow up 6 months and sooner as needed.

## 2011-12-06 NOTE — Assessment & Plan Note (Signed)
Persistent nasal drainage consistent with allergic rhinitis. Will try adding nasal steroid. Sample of Nasonex given today. Patient will e-mail or call with update later this week.

## 2011-12-06 NOTE — Progress Notes (Signed)
Subjective:    Patient ID: Lucas Black, male    DOB: 06-09-1940, 71 y.o.   MRN: [id number]  HPI  The patient is here for annual Medicare wellness examination and management of other chronic and acute problems.   The risk factors are reflected in the social history.  The roster of all physicians providing medical care to patient - is listed in the Snapshot section of the chart.  Activities of daily living:  The patient is 100% independent in all ADLs: dressing, toileting, feeding as well as independent mobility  Home safety : The patient has smoke detectors in the home. They wear seatbelts.  There are locked firearms at home. There is no violence in the home.   There is no risks for hepatitis, STDs or HIV. There is a history of blood transfusion in 1970s during operation for bleeding ulcer. They have no travel history to infectious disease endemic areas of the world.  The patient has seen their dentist in the last six month. (Dr. Ferne Reus? Unsure name) They have seen their eye doctor in the last year. (in [place], unsure name) No issues with hearing currently. Had hearing aids in past, but does not use them. They have deferred audiologic testing in the last year.    They do not  have excessive sun exposure. Discussed the need for sun protection: hats, long sleeves and use of sunscreen if there is significant sun exposure.   Diet: the importance of a healthy diet is discussed. They do have a healthy diet.  The benefits of regular aerobic exercise were discussed. Stays generally active, walks.  Depression screen: there are no signs or vegative symptoms of depression- irritability, change in appetite, anhedonia, sadness/tearfullness.  Cognitive assessment: the patient manages all their financial and personal affairs and is actively engaged. They could relate day,date,year and events.  No HCPOA or living will. Paperwork given.  The following portions of the patient's history were reviewed and  updated as appropriate: allergies, current medications, past family history, past medical history,  past surgical history, past social history  and problem list.  Visual acuity was not assessed per patient preference since he has regular follow up with her ophthalmologist. Hearing and body mass index were assessed and reviewed.   During the course of the visit the patient was educated and counseled about appropriate screening and preventive services including : fall prevention , diabetes screening, nutrition counseling, colorectal cancer screening, and recommended immunizations.    Patient's only other concern today is some persistent clear nasal drainage over the last few weeks. He occasionally has sneezing with this. He denies any fever, chills, cough, chest pain, shortness of breath.  Outpatient Encounter Prescriptions as of 12/06/2011  Medication Sig Dispense Refill  . albuterol (PROVENTIL HFA;VENTOLIN HFA) 108 (90 BASE) MCG/ACT inhaler Inhale 2 puffs into the lungs every 6 (six) hours as needed for wheezing.  3 Inhaler  4  . budesonide-formoterol (SYMBICORT) 160-4.5 MCG/ACT inhaler Inhale 2 puffs into the lungs 2 (two) times daily.  3 Inhaler  4  . chlorpheniramine-HYDROcodone (TUSSIONEX) 10-8 MG/5ML LQCR Take 5 mLs by mouth every 12 (twelve) hours as needed.  140 mL  1  . finasteride (PROSCAR) 5 MG tablet Take 1 tablet (5 mg total) by mouth daily.  90 tablet  3  . lisinopril (PRINIVIL,ZESTRIL) 10 MG tablet Take 1 tablet (10 mg total) by mouth daily.  90 tablet  3  . simvastatin (ZOCOR) 20 MG tablet Take 1 tablet (20 mg total) by  mouth at bedtime.  90 tablet  3  . terazosin (HYTRIN) 5 MG capsule Take 1 capsule (5 mg total) by mouth at bedtime.  90 capsule  3   BP 130/90  Pulse 63  Temp 97.9 F (36.6 C) (Oral)  Ht 5\' 6"  (1.676 m)  Wt 188 lb (85.276 kg)  BMI 30.34 kg/m2  SpO2 97%  Review of Systems  Constitutional: Negative for fever, chills, activity change, appetite change, fatigue  and unexpected weight change.  HENT: Positive for rhinorrhea, sneezing and postnasal drip. Negative for sore throat, trouble swallowing and sinus pressure.   Eyes: Negative for visual disturbance.  Respiratory: Negative for cough and shortness of breath.   Cardiovascular: Negative for chest pain, palpitations and leg swelling.  Gastrointestinal: Negative for nausea, vomiting, abdominal pain, diarrhea, constipation, blood in stool, abdominal distention and rectal pain.  Genitourinary: Negative for dysuria, urgency and difficulty urinating.  Musculoskeletal: Negative for arthralgias and gait problem.  Skin: Negative for color change and rash.  Hematological: Negative for adenopathy.  Psychiatric/Behavioral: Negative for disturbed wake/sleep cycle and dysphoric mood. The patient is not nervous/anxious.        Objective:   Physical Exam  Constitutional: He is oriented to person, place, and time. He appears well-developed and well-nourished. No distress.  HENT:  Head: Normocephalic and atraumatic.  Right Ear: External ear normal.  Left Ear: External ear normal.  Nose: Nose normal.  Mouth/Throat: Oropharynx is clear and moist. No oropharyngeal exudate.  Eyes: Conjunctivae normal and EOM are normal. Pupils are equal, round, and reactive to light. Right eye exhibits no discharge. Left eye exhibits no discharge. No scleral icterus.  Neck: Normal range of motion. Neck supple. No tracheal deviation present. No thyromegaly present.  Cardiovascular: Normal rate, regular rhythm and normal heart sounds.  Exam reveals no gallop and no friction rub.   No murmur heard. Pulmonary/Chest: Effort normal and breath sounds normal. No respiratory distress. He has no wheezes. He has no rales. He exhibits no tenderness.  Abdominal: Soft. Bowel sounds are normal. He exhibits no distension and no mass. There is no tenderness. There is no rebound and no guarding.  Musculoskeletal: Normal range of motion. He exhibits  no edema.  Lymphadenopathy:    He has no cervical adenopathy.  Neurological: He is alert and oriented to person, place, and time. No cranial nerve deficit. Coordination normal.  Skin: Skin is warm and dry. No rash noted. He is not diaphoretic. No erythema. No pallor.  Psychiatric: He has a normal mood and affect. His behavior is normal. Judgment and thought content normal.          Assessment & Plan:

## 2011-12-07 ENCOUNTER — Other Ambulatory Visit (INDEPENDENT_AMBULATORY_CARE_PROVIDER_SITE_OTHER): Payer: Medicare Other

## 2011-12-07 DIAGNOSIS — Z0000 Encounter for general adult medical examination without abnormal findings: Secondary | ICD-10-CM

## 2011-12-07 DIAGNOSIS — Z125 Encounter for screening for malignant neoplasm of prostate: Secondary | ICD-10-CM

## 2011-12-07 DIAGNOSIS — E785 Hyperlipidemia, unspecified: Secondary | ICD-10-CM

## 2011-12-07 LAB — COMPREHENSIVE METABOLIC PANEL
ALT: 18 U/L (ref 0–53)
AST: 17 U/L (ref 0–37)
Albumin: 3.7 g/dL (ref 3.5–5.2)
Alkaline Phosphatase: 71 U/L (ref 39–117)
Calcium: 9.3 mg/dL (ref 8.4–10.5)
Chloride: 108 mEq/L (ref 96–112)
Potassium: 4.5 mEq/L (ref 3.5–5.1)

## 2011-12-07 LAB — LIPID PANEL
HDL: 49.1 mg/dL (ref >39.00)
LDL Cholesterol: 82 mg/dL (ref 0–99)
Total CHOL/HDL Ratio: 3

## 2011-12-07 LAB — PSA, MEDICARE: PSA: 0.35 ng/ml (ref 0.10–4.00)

## 2011-12-10 ENCOUNTER — Encounter: Payer: Self-pay | Admitting: Internal Medicine

## 2011-12-10 ENCOUNTER — Other Ambulatory Visit: Payer: Self-pay | Admitting: Internal Medicine

## 2011-12-10 MED ORDER — AZITHROMYCIN 250 MG PO TABS: ORAL_TABLET | ORAL | Status: DC

## 2011-12-24 ENCOUNTER — Telehealth: Payer: Self-pay | Admitting: Internal Medicine

## 2011-12-26 ENCOUNTER — Ambulatory Visit (INDEPENDENT_AMBULATORY_CARE_PROVIDER_SITE_OTHER): Payer: Medicare Other | Admitting: Internal Medicine

## 2011-12-26 ENCOUNTER — Encounter: Payer: Self-pay | Admitting: Internal Medicine

## 2011-12-26 VITALS — BP 116/68 | HR 82 | Temp 98.1°F | Ht 66.0 in | Wt 188.0 lb

## 2011-12-26 DIAGNOSIS — J069 Acute upper respiratory infection, unspecified: Secondary | ICD-10-CM

## 2011-12-26 DIAGNOSIS — J449 Chronic obstructive pulmonary disease, unspecified: Secondary | ICD-10-CM | POA: Diagnosis not present

## 2011-12-26 DIAGNOSIS — J329 Chronic sinusitis, unspecified: Secondary | ICD-10-CM | POA: Diagnosis not present

## 2011-12-26 MED ORDER — FLUTICASONE PROPIONATE 50 MCG/ACT NA SUSP: 2.0000 | Freq: Every day | NASAL | Status: DC

## 2011-12-26 MED ORDER — CEFDINIR 300 MG PO CAPS: 300.0000 mg | ORAL_CAPSULE | Freq: Two times a day (BID) | ORAL | Status: DC

## 2011-12-26 NOTE — Patient Instructions (Signed)
It was nice meeting you today.  I am sorry you have not been feeling well.  I am going to prescribe an antibiotic (Omnicef) to take twice a day.  Flonase nasal spray (prescription nose spray) - two sprays in each nostril in the evening.  Flush with saline nasal spray - 2-3x/day.  Mucinex in the am and Robitussin in the evening.  Let us know if symptoms do not resolve or if they worsen.

## 2011-12-27 ENCOUNTER — Encounter: Payer: Self-pay | Admitting: Internal Medicine

## 2011-12-27 NOTE — Progress Notes (Signed)
  Subjective:    Patient ID: Lucas Black, male    DOB: 1940/08/08, 71 y.o.   MRN: [id number]  HPI 71 year old male with past history of hypertension and hyperlipidemia who comes in today as a work in with concerns regarding increased sinus congestion and pressure.  Was evaluated a several weeks ago and symptoms were initially felt to be allergies.  Symptoms progressed.  Was treated with antibiotics and prednisone.  Subsequently had another round of antibiotics.  States he was doing relatively well until one week ago.  Now with increased sinus pressure and drainage.  Sore throat.  Nasal congestion.  Left ear was hurting yesterday - not as sore today.  No hearing change.  Some cough - occasionally productive.  Taking Mucinex.  No chest pain or tightness.  No wheezing or sob.    Past Medical History  Diagnosis Date  . Hyperlipidemia   . Hypertension   . Screening for colon cancer 2011    colonoscopy normal    Review of Systems Patient denies any headache, lightheadedness or dizziness.  Sinus symptoms as outlined.  No chest pain, tightness or palpatations.  No increased shortness of breath or wheezing.  No nausea or vomiting.  No abdominal pain or cramping.  Eating and drinking well.  No bowel change, such as diarrhea, constipation, BRBPR or melana.       Objective:   Physical Exam Filed Vitals:   12/26/11 1022  BP: 116/68  Pulse: 82  Temp: 98.1 F (44.32 C)   71 year old male in no acute distress.   HEENT:  Nares - clear except erythematous turbinates.  Left ear canal - cerumen present.  Visualized TM - minimal erythema.  OP- without lesions or erythema.  Minimal sinus tenderness to palpation (maxillary sinus).   NECK:  Supple, nontender.    HEART:  Appears to be regular. LUNGS:  Without crackles or wheezing audible.  Respirations even and unlabored.   RADIAL PULSE:  Equal bilaterally.                     Assessment & Plan:  SINUSITIS/URI - Possible left otitis media.  Treat with  Omnicef 300mg  2/day as directed.  Continue mucinex in the am and Robitussin in the evening.  Flonase nasal spray as directed.  Saline flushes - 2-3x/day.  Continue inhalers as he is doing.  Rest.  Fluids.  Explained to him if his symptoms changed, worsened or did not resolve - he was to be reevaluated.  If persistent reoccurring problems - may need ENT referral.

## 2011-12-27 NOTE — Assessment & Plan Note (Signed)
Breathing stable.  Continue current inhalers.  Treat infection as outlined.

## 2012-01-04 DIAGNOSIS — I10 Essential (primary) hypertension: Secondary | ICD-10-CM | POA: Diagnosis not present

## 2012-01-04 DIAGNOSIS — E785 Hyperlipidemia, unspecified: Secondary | ICD-10-CM | POA: Diagnosis not present

## 2012-01-04 DIAGNOSIS — I059 Rheumatic mitral valve disease, unspecified: Secondary | ICD-10-CM | POA: Diagnosis not present

## 2012-03-29 IMAGING — CR DG CHEST 2V
1 series · 2 of 2 positions shown · non-contrast
Comparison: none

REASON FOR EXAM: cough wheezing R LL   crackles
COMMENTS:

PROCEDURE:     DXR - DXR CHEST PA (OR AP) AND LATERAL  - April 11, 2011 [DATE]
RESULT:     The lung fields are clear. No pneumonia, pneumothorax or pleural
effusion is seen. Heart size is normal. The mediastinal and osseous
structures reveal no significant abnormalities.

[Series 1: w chest pa · 0.14mm/px · 2 of 2 slices shown]
[im 1/2]
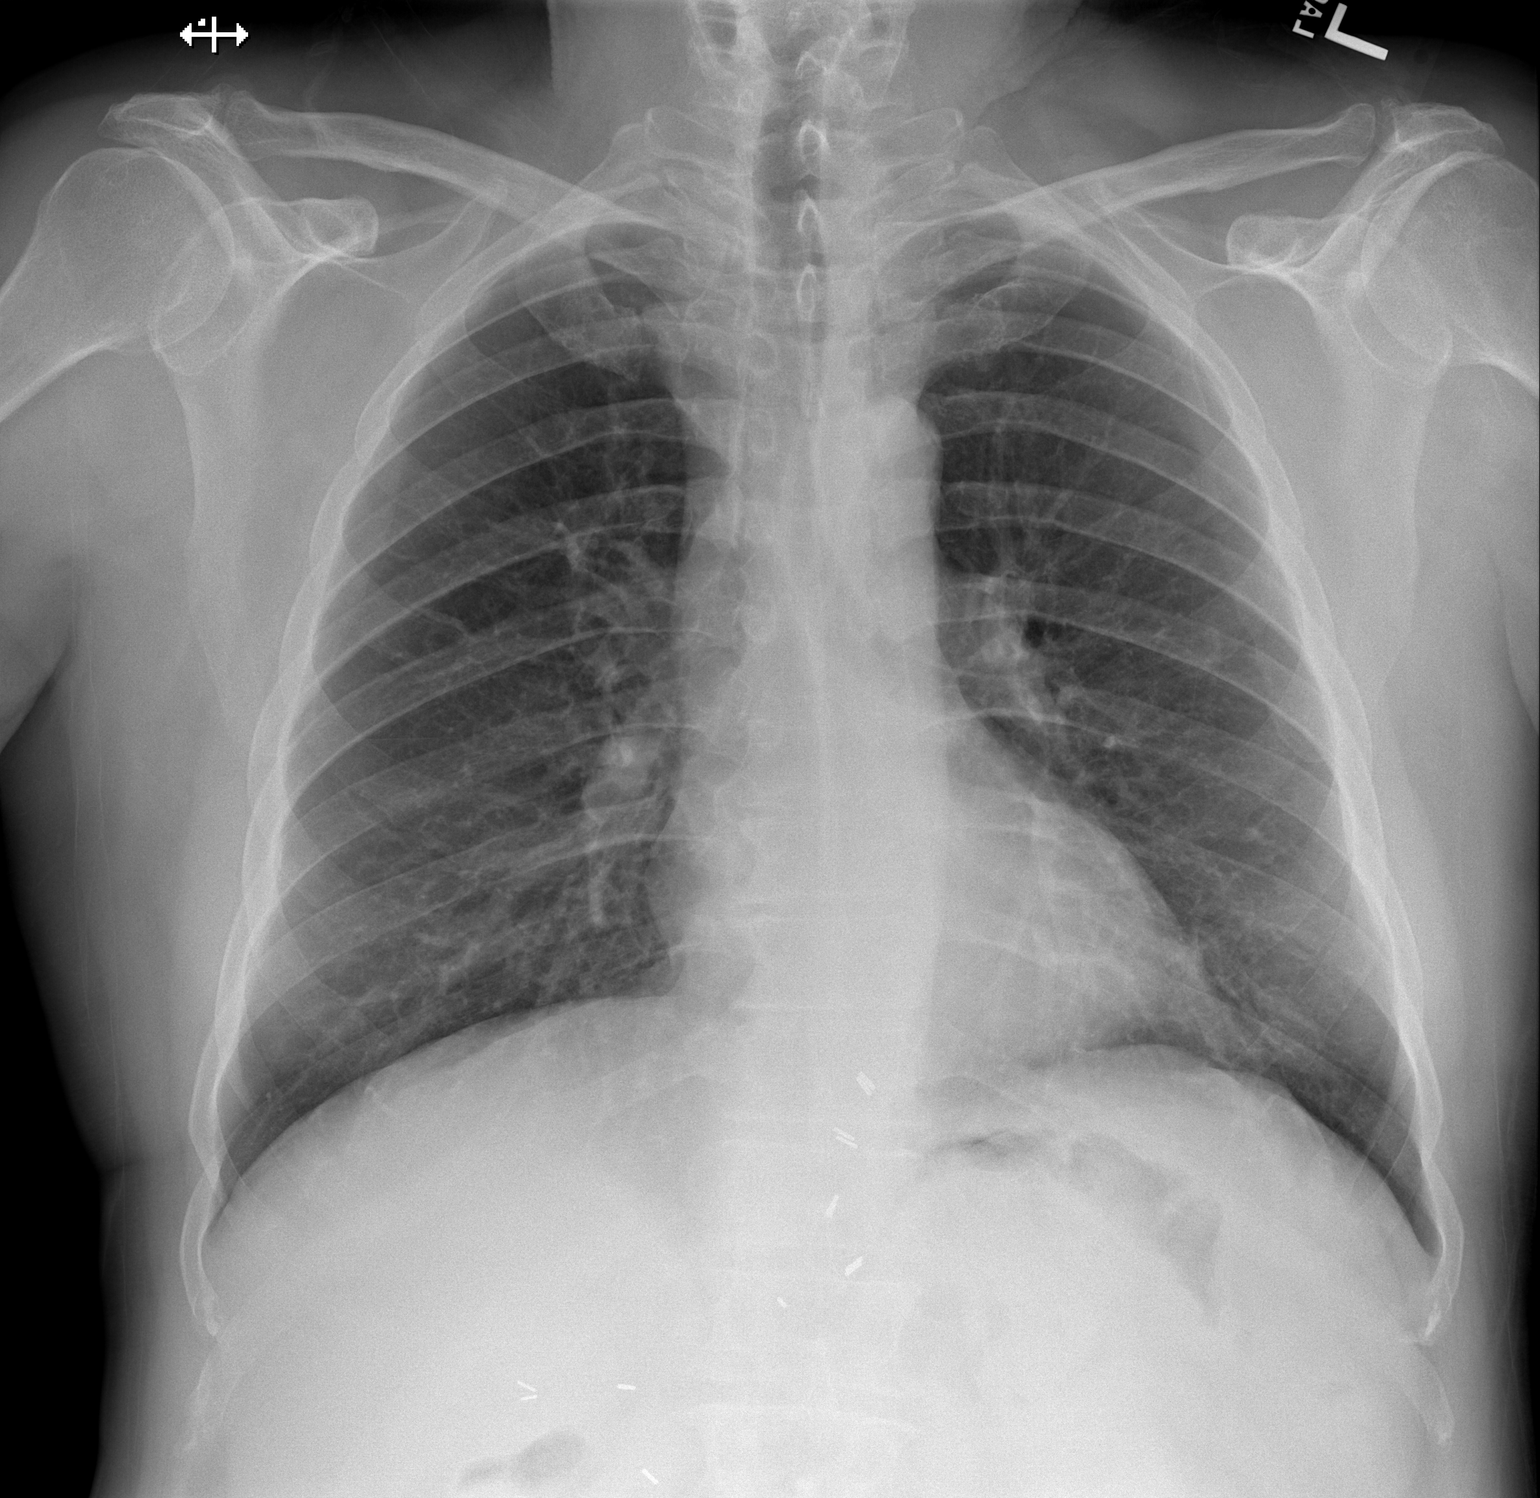
[im 2/2]
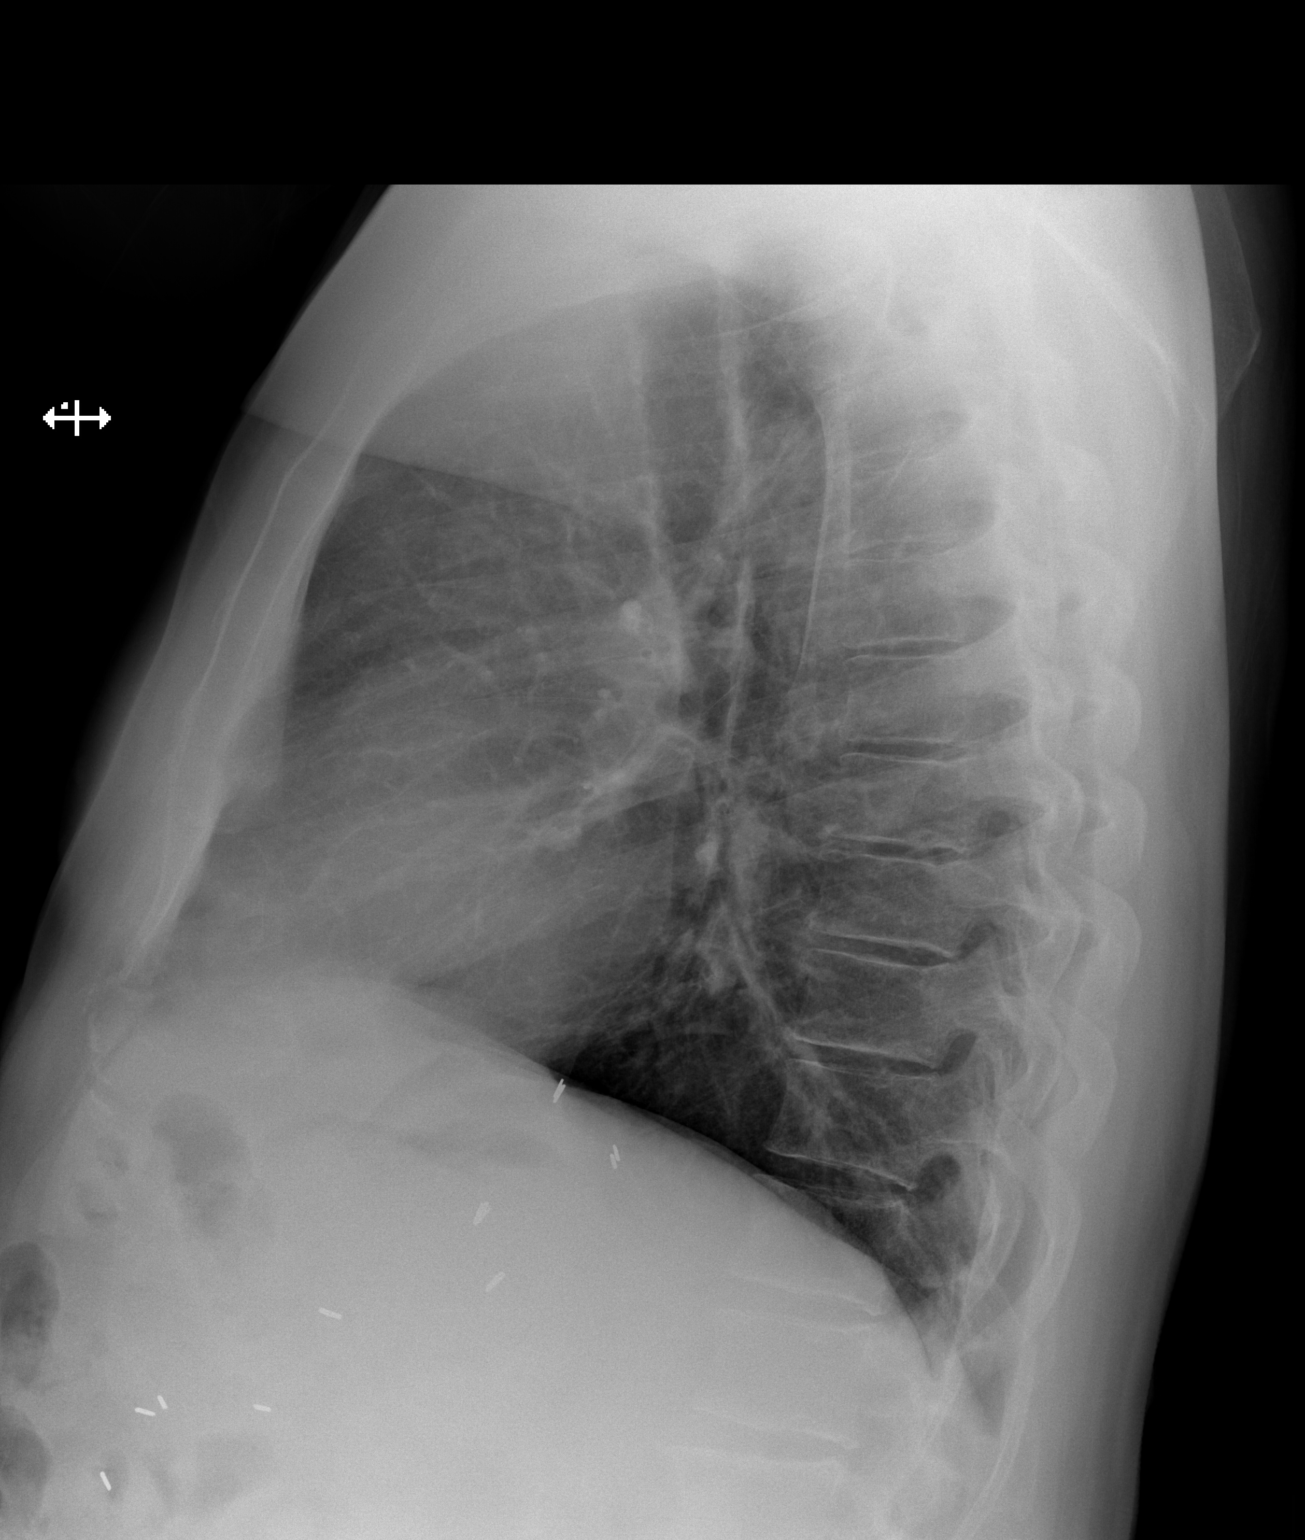

[2 of 2 positions shown; findings below may reference images not displayed]

IMPRESSION: 1.     No acute changes are identified.

## 2012-04-19 ENCOUNTER — Other Ambulatory Visit: Payer: Self-pay

## 2012-06-06 ENCOUNTER — Ambulatory Visit (INDEPENDENT_AMBULATORY_CARE_PROVIDER_SITE_OTHER): Payer: Medicare Other | Admitting: Internal Medicine

## 2012-06-06 ENCOUNTER — Encounter: Payer: Self-pay | Admitting: Internal Medicine

## 2012-06-06 VITALS — BP 132/80 | HR 72 | Temp 98.0°F | Wt 185.0 lb

## 2012-06-06 DIAGNOSIS — J4489 Other specified chronic obstructive pulmonary disease: Secondary | ICD-10-CM

## 2012-06-06 DIAGNOSIS — J449 Chronic obstructive pulmonary disease, unspecified: Secondary | ICD-10-CM

## 2012-06-06 DIAGNOSIS — I10 Essential (primary) hypertension: Secondary | ICD-10-CM

## 2012-06-06 DIAGNOSIS — E785 Hyperlipidemia, unspecified: Secondary | ICD-10-CM | POA: Diagnosis not present

## 2012-06-06 LAB — COMPREHENSIVE METABOLIC PANEL
ALT: 16 U/L (ref 0–53)
AST: 20 U/L (ref 0–37)
Alkaline Phosphatase: 77 U/L (ref 39–117)
Calcium: 9.2 mg/dL (ref 8.4–10.5)
Chloride: 107 mEq/L (ref 96–112)
Creatinine, Ser: 1.4 mg/dL (ref 0.4–1.5)
Potassium: 4.6 mEq/L (ref 3.5–5.1)

## 2012-06-06 LAB — MICROALBUMIN / CREATININE URINE RATIO
Creatinine,U: 191.6 mg/dL
Microalb Creat Ratio: 0.2 mg/g (ref 0.0–30.0)

## 2012-06-06 NOTE — Progress Notes (Signed)
Subjective:    Patient ID: Lucas Black, male    DOB: Jan 11, 1941, 72 y.o.   MRN: [id number]  HPI 72YO male with HTN, HL, COPD presents for follow up. Doing well. No concerns today. Compliant with medications. No recent chest pain, dyspnea, cough, wheezing. Only occasionally uses Symbicort. No recent illnesses. No changes in energy level, appetite.  Outpatient Encounter Prescriptions as of 06/06/2012  Medication Sig Dispense Refill  . albuterol (PROVENTIL HFA;VENTOLIN HFA) 108 (90 BASE) MCG/ACT inhaler Inhale 2 puffs into the lungs every 6 (six) hours as needed for wheezing.  3 Inhaler  4  . budesonide-formoterol (SYMBICORT) 160-4.5 MCG/ACT inhaler Inhale 2 puffs into the lungs 2 (two) times daily.  3 Inhaler  4  . finasteride (PROSCAR) 5 MG tablet Take 1 tablet (5 mg total) by mouth daily.  90 tablet  3  . fluticasone (FLONASE) 50 MCG/ACT nasal spray Place 2 sprays into the nose daily.  16 g  0  . lisinopril (PRINIVIL,ZESTRIL) 10 MG tablet Take 1 tablet (10 mg total) by mouth daily.  90 tablet  3  . simvastatin (ZOCOR) 20 MG tablet Take 1 tablet (20 mg total) by mouth at bedtime.  90 tablet  3  . terazosin (HYTRIN) 5 MG capsule Take 1 capsule (5 mg total) by mouth at bedtime.  90 capsule  3   No facility-administered encounter medications on file as of 06/06/2012.   BP 132/80  Pulse 72  Temp(Src) 98 F (36.7 C) (Oral)  Wt 185 lb (83.915 kg)  BMI 29.87 kg/m2  SpO2 96%  Review of Systems  Constitutional: Negative for fever, chills, activity change, appetite change, fatigue and unexpected weight change.  Eyes: Negative for visual disturbance.  Respiratory: Negative for cough and shortness of breath.   Cardiovascular: Negative for chest pain, palpitations and leg swelling.  Gastrointestinal: Negative for abdominal pain and abdominal distention.  Genitourinary: Negative for dysuria, urgency and difficulty urinating.  Musculoskeletal: Negative for arthralgias and gait problem.  Skin: Negative  for color change and rash.  Hematological: Negative for adenopathy.  Psychiatric/Behavioral: Negative for sleep disturbance and dysphoric mood. The patient is not nervous/anxious.        Objective:   Physical Exam  Constitutional: He is oriented to person, place, and time. He appears well-developed and well-nourished. No distress.  HENT:  Head: Normocephalic and atraumatic.  Right Ear: External ear normal.  Left Ear: External ear normal.  Nose: Nose normal.  Mouth/Throat: Oropharynx is clear and moist. No oropharyngeal exudate.  Eyes: Conjunctivae and EOM are normal. Pupils are equal, round, and reactive to light. Right eye exhibits no discharge. Left eye exhibits no discharge. No scleral icterus.  Neck: Normal range of motion. Neck supple. No tracheal deviation present. No thyromegaly present.  Cardiovascular: Normal rate, regular rhythm and normal heart sounds.  Exam reveals no gallop and no friction rub.   No murmur heard. Pulmonary/Chest: Effort normal and breath sounds normal. No respiratory distress. He has no wheezes. He has no rales. He exhibits no tenderness.  Musculoskeletal: Normal range of motion. He exhibits no edema.  Lymphadenopathy:    He has no cervical adenopathy.  Neurological: He is alert and oriented to person, place, and time. No cranial nerve deficit. Coordination normal.  Skin: Skin is warm and dry. No rash noted. He is not diaphoretic. No erythema. No pallor.  Psychiatric: He has a normal mood and affect. His behavior is normal. Judgment and thought content normal.  Assessment & Plan:

## 2012-06-06 NOTE — Assessment & Plan Note (Signed)
BP Readings from Last 3 Encounters:  06/06/12 132/80  12/26/11 116/68  12/06/11 130/90   BP well controlled on current medications. Will continue. Check renal function today. Follow up 6 months and prn.

## 2012-06-06 NOTE — Assessment & Plan Note (Signed)
Lab Results  Component Value Date   LDLCALC 82 12/07/2011   Lipids well controlled on current medications. Will continue. Check LFTs today. Follow up 6 months and prn.

## 2012-06-06 NOTE — Assessment & Plan Note (Signed)
Symptoms well controlled. Not using Symbicort on regular basis. Will continue to monitor.

## 2012-06-12 ENCOUNTER — Telehealth: Payer: Self-pay | Admitting: *Deleted

## 2012-06-12 NOTE — Telephone Encounter (Signed)
Pt is coming in tomorrow 04.11.2014, is it just for a cmet? If so what dx code?

## 2012-06-12 NOTE — Telephone Encounter (Signed)
Please read below...

## 2012-06-12 NOTE — Telephone Encounter (Signed)
BMP is fine

## 2012-06-13 ENCOUNTER — Other Ambulatory Visit (INDEPENDENT_AMBULATORY_CARE_PROVIDER_SITE_OTHER): Payer: Medicare Other

## 2012-06-13 DIAGNOSIS — I10 Essential (primary) hypertension: Secondary | ICD-10-CM | POA: Diagnosis not present

## 2012-06-13 LAB — BASIC METABOLIC PANEL
CO2: 26 mEq/L (ref 19–32)
Chloride: 102 mEq/L (ref 96–112)
Creatinine, Ser: 1.1 mg/dL (ref 0.4–1.5)
Potassium: 3.9 mEq/L (ref 3.5–5.1)

## 2012-07-03 ENCOUNTER — Encounter: Payer: Self-pay | Admitting: Internal Medicine

## 2012-07-03 ENCOUNTER — Ambulatory Visit (INDEPENDENT_AMBULATORY_CARE_PROVIDER_SITE_OTHER): Payer: Medicare Other | Admitting: Internal Medicine

## 2012-07-03 VITALS — BP 112/78 | HR 79 | Temp 98.2°F | Wt 185.0 lb

## 2012-07-03 DIAGNOSIS — J209 Acute bronchitis, unspecified: Secondary | ICD-10-CM | POA: Diagnosis not present

## 2012-07-03 MED ORDER — LEVOFLOXACIN 750 MG PO TABS: 750.0000 mg | ORAL_TABLET | Freq: Every day | ORAL | Status: AC

## 2012-07-03 MED ORDER — PREDNISONE (PAK) 10 MG PO TABS: ORAL_TABLET | ORAL | Status: DC

## 2012-07-03 NOTE — Assessment & Plan Note (Signed)
Symptoms and exam consistent with acute bronchitis. Will treat with prednisone taper and levaquin. Continue prn albuterol and prn tussionex for cough. Follow up if symptoms are not improving over next 48hr.

## 2012-07-03 NOTE — Progress Notes (Signed)
Subjective:    Patient ID: Lucas Black, male    DOB: December 27, 1940, 72 y.o.   MRN: [id number]  HPI [name] with h/o COPD presents for acute visit complaining of 2 week history of cough productive of purulent sputum, dyspnea, fatigue. Denies fever or chills. Using albuterol prn with no improvement.  Outpatient Encounter Prescriptions as of 07/03/2012  Medication Sig Dispense Refill  . albuterol (PROVENTIL HFA;VENTOLIN HFA) 108 (90 BASE) MCG/ACT inhaler Inhale 2 puffs into the lungs every 6 (six) hours as needed for wheezing.  3 Inhaler  4  . budesonide-formoterol (SYMBICORT) 160-4.5 MCG/ACT inhaler Inhale 2 puffs into the lungs 2 (two) times daily.  3 Inhaler  4  . finasteride (PROSCAR) 5 MG tablet Take 1 tablet (5 mg total) by mouth daily.  90 tablet  3  . fluticasone (FLONASE) 50 MCG/ACT nasal spray Place 2 sprays into the nose daily.  16 g  0  . lisinopril (PRINIVIL,ZESTRIL) 10 MG tablet Take 1 tablet (10 mg total) by mouth daily.  90 tablet  3  . simvastatin (ZOCOR) 20 MG tablet Take 1 tablet (20 mg total) by mouth at bedtime.  90 tablet  3  . terazosin (HYTRIN) 5 MG capsule Take 1 capsule (5 mg total) by mouth at bedtime.  90 capsule  3  . levofloxacin (LEVAQUIN) 750 MG tablet Take 1 tablet (750 mg total) by mouth daily.  7 tablet  0  . predniSONE (STERAPRED UNI-PAK) 10 MG tablet Take 60mg  day 1 then taper by 10mg  daily  21 tablet  0   No facility-administered encounter medications on file as of 07/03/2012.   BP 112/78  Pulse 79  Temp(Src) 98.2 F (36.8 C) (Oral)  Wt 185 lb (83.915 kg)  BMI 29.87 kg/m2  SpO2 97%  Review of Systems  Constitutional: Positive for fatigue. Negative for fever, chills, activity change, appetite change and unexpected weight change.  HENT: Positive for congestion, rhinorrhea and postnasal drip. Negative for ear pain and sinus pressure.   Eyes: Negative for visual disturbance.  Respiratory: Positive for cough, shortness of breath and wheezing.    Cardiovascular: Negative for chest pain, palpitations and leg swelling.  Gastrointestinal: Negative for abdominal pain and abdominal distention.  Genitourinary: Negative for dysuria, urgency and difficulty urinating.  Musculoskeletal: Negative for arthralgias and gait problem.  Skin: Negative for color change and rash.  Hematological: Negative for adenopathy.  Psychiatric/Behavioral: Negative for sleep disturbance and dysphoric mood. The patient is not nervous/anxious.        Objective:   Physical Exam  Constitutional: He is oriented to person, place, and time. He appears well-developed and well-nourished. No distress.  HENT:  Head: Normocephalic and atraumatic.  Right Ear: External ear normal.  Left Ear: External ear normal.  Nose: Nose normal.  Mouth/Throat: Oropharynx is clear and moist. No oropharyngeal exudate.  Eyes: Conjunctivae and EOM are normal. Pupils are equal, round, and reactive to light. Right eye exhibits no discharge. Left eye exhibits no discharge. No scleral icterus.  Neck: Normal range of motion. Neck supple. No tracheal deviation present. No thyromegaly present.  Cardiovascular: Normal rate, regular rhythm and normal heart sounds.  Exam reveals no gallop and no friction rub.   No murmur heard. Pulmonary/Chest: Effort normal. No accessory muscle usage. Not tachypneic. No respiratory distress. He has decreased breath sounds (prolonged expiration). He has wheezes (few scattered). He has no rhonchi. He has no rales. He exhibits no tenderness.  Musculoskeletal: Normal range of motion. He exhibits no edema.  Lymphadenopathy:    He has no cervical adenopathy.  Neurological: He is alert and oriented to person, place, and time. No cranial nerve deficit. Coordination normal.  Skin: Skin is warm and dry. No rash noted. He is not diaphoretic. No erythema. No pallor.  Psychiatric: He has a normal mood and affect. His behavior is normal. Judgment and thought content normal.           Assessment & Plan:

## 2012-07-15 ENCOUNTER — Ambulatory Visit (INDEPENDENT_AMBULATORY_CARE_PROVIDER_SITE_OTHER): Payer: Medicare Other | Admitting: Internal Medicine

## 2012-07-15 ENCOUNTER — Encounter: Payer: Self-pay | Admitting: Internal Medicine

## 2012-07-15 VITALS — BP 118/72 | HR 93 | Temp 99.0°F | Ht 66.0 in | Wt 181.8 lb

## 2012-07-15 DIAGNOSIS — J209 Acute bronchitis, unspecified: Secondary | ICD-10-CM

## 2012-07-15 DIAGNOSIS — R062 Wheezing: Secondary | ICD-10-CM | POA: Diagnosis not present

## 2012-07-15 MED ORDER — PREDNISONE 10 MG PO TABS: ORAL_TABLET | ORAL | Status: DC

## 2012-07-15 MED ORDER — LEVOFLOXACIN 500 MG PO TABS: 500.0000 mg | ORAL_TABLET | Freq: Every day | ORAL | Status: DC

## 2012-07-15 MED ORDER — ALBUTEROL SULFATE (2.5 MG/3ML) 0.083% IN NEBU
2.5000 mg | INHALATION_SOLUTION | Freq: Once | RESPIRATORY_TRACT | Status: AC
  Administered 2012-07-15: 2.5 mg via RESPIRATORY_TRACT

## 2012-07-15 NOTE — Patient Instructions (Addendum)
Take levaquin 500mg  - one per day.  Prednisone taper as directed.  Use the nasonex - two sprays each nostril in the evening.  Saline nasal spray - flush nose at least 2-3x/day.  mucinex in the am and Robitussin in the evening

## 2012-07-15 NOTE — Progress Notes (Signed)
Subjective:    Patient ID: Lucas Black, male    DOB: 05-05-1940, 72 y.o.   MRN: [id number]  Cough  72 year old male with past history of hypertension and hyperlipidemia who comes in today as a work in with concerns regarding increased sinus congestion, chest congestion and cough.  He has had intermittent episodes recently - cough/congestion.  Has been on abx and prednisone recently.  States previous infection cleared, but after being off the medication for a few days - symptoms returned.  Within the past week, developed increased chest congestion, productive cough and chest congestion.  Increased wheezing.  Increased sinus pressure and drainage.  No nausea or vomiting.  Eating and drinking.  Using symbicort bid.      Past Medical History  Diagnosis Date  . Hyperlipidemia   . Hypertension   . Screening for colon cancer 2011    colonoscopy normal    Current Outpatient Prescriptions on File Prior to Visit  Medication Sig Dispense Refill  . albuterol (PROVENTIL HFA;VENTOLIN HFA) 108 (90 BASE) MCG/ACT inhaler Inhale 2 puffs into the lungs every 6 (six) hours as needed for wheezing.  3 Inhaler  4  . budesonide-formoterol (SYMBICORT) 160-4.5 MCG/ACT inhaler Inhale 2 puffs into the lungs 2 (two) times daily.  3 Inhaler  4  . finasteride (PROSCAR) 5 MG tablet Take 1 tablet (5 mg total) by mouth daily.  90 tablet  3  . fluticasone (FLONASE) 50 MCG/ACT nasal spray Place 2 sprays into the nose daily.  16 g  0  . lisinopril (PRINIVIL,ZESTRIL) 10 MG tablet Take 1 tablet (10 mg total) by mouth daily.  90 tablet  3  . predniSONE (STERAPRED UNI-PAK) 10 MG tablet Take 60mg  day 1 then taper by 10mg  daily  21 tablet  0  . simvastatin (ZOCOR) 20 MG tablet Take 1 tablet (20 mg total) by mouth at bedtime.  90 tablet  3  . terazosin (HYTRIN) 5 MG capsule Take 1 capsule (5 mg total) by mouth at bedtime.  90 capsule  3   No current facility-administered medications on file prior to visit.    Review of Systems   Respiratory: Positive for cough.   Patient denies any headache, lightheadedness or dizziness.  Increased sinus pressure and drainage.  Chest soreness form coughing.  Increased cough and chest congestion.  Wheezing.  Cough productive.  No nausea or vomiting.  No abdominal pain or cramping.  Eating and drinking well.  No bowel change, such as diarrhea.        Objective:   Physical Exam  Filed Vitals:   07/15/12 1503  BP: 118/72  Pulse: 91  Temp: 99 F (76.62 C)   72 year old male in no acute distress.   HEENT:  Nares - clear except erythematous turbinates.  Left ear canal - cerumen present.  Visualized TM - no erythema.  OP- without lesions or erythema.  Minimal sinus tenderness to palpation (maxillary sinus).   NECK:  Supple, nontender.    HEART:  Appears to be regular. LUNGS:  No crackles.  Increased congestion.  Increased cough and wheezing with forced expiration.  Respirations even and unlabored.   RADIAL PULSE:  Equal bilaterally.                     Assessment & Plan:  URI/BRONCHITIS/SINUSITIS.  Treat with Levaquin 500mg  q day x 10 days.   Mucinex in the am and Robitussin in the evening.  Flonase nasal spray as directed.  Saline flushes - 2-3x/day.  Continue inhalers as he is doing.  Given albuterol neb here in the office.  Increased air movement.  Post neb pulse ox 95%.  Prednisone taper - starting at 60mg  and decreasing by 5mg  each day until off.  Rest.  Fluids.  Explained to him if his symptoms changed, worsened or did not resolve - he was to be reevaluated.

## 2012-07-16 ENCOUNTER — Encounter: Payer: Self-pay | Admitting: Internal Medicine

## 2012-08-05 ENCOUNTER — Encounter: Payer: Self-pay | Admitting: Internal Medicine

## 2012-11-26 ENCOUNTER — Other Ambulatory Visit: Payer: Self-pay | Admitting: Internal Medicine

## 2012-12-10 ENCOUNTER — Encounter: Payer: Medicare Other | Admitting: Internal Medicine

## 2012-12-19 ENCOUNTER — Encounter: Payer: Self-pay | Admitting: Internal Medicine

## 2012-12-19 ENCOUNTER — Encounter: Payer: Medicare Other | Admitting: Internal Medicine

## 2012-12-19 ENCOUNTER — Ambulatory Visit (INDEPENDENT_AMBULATORY_CARE_PROVIDER_SITE_OTHER): Payer: Medicare Other | Admitting: Internal Medicine

## 2012-12-19 VITALS — BP 130/72 | HR 60 | Temp 97.8°F | Ht 65.0 in | Wt 185.0 lb

## 2012-12-19 DIAGNOSIS — N400 Enlarged prostate without lower urinary tract symptoms: Secondary | ICD-10-CM | POA: Diagnosis not present

## 2012-12-19 DIAGNOSIS — I10 Essential (primary) hypertension: Secondary | ICD-10-CM | POA: Diagnosis not present

## 2012-12-19 DIAGNOSIS — E785 Hyperlipidemia, unspecified: Secondary | ICD-10-CM

## 2012-12-19 DIAGNOSIS — Z23 Encounter for immunization: Secondary | ICD-10-CM | POA: Diagnosis not present

## 2012-12-19 DIAGNOSIS — Z0000 Encounter for general adult medical examination without abnormal findings: Secondary | ICD-10-CM

## 2012-12-19 LAB — COMPREHENSIVE METABOLIC PANEL
Albumin: 4.3 g/dL (ref 3.5–5.2)
Alkaline Phosphatase: 71 U/L (ref 39–117)
BUN: 20 mg/dL (ref 6–23)
Creatinine, Ser: 1.2 mg/dL (ref 0.4–1.5)
GFR: 62.44 mL/min (ref >60.00)
Glucose, Bld: 93 mg/dL (ref 70–99)
Potassium: 4.8 mEq/L (ref 3.5–5.1)
Total Bilirubin: 0.7 mg/dL (ref 0.3–1.2)
Total Protein: 6.7 g/dL (ref 6.0–8.3)

## 2012-12-19 LAB — MICROALBUMIN / CREATININE URINE RATIO
Creatinine,U: 47.7 mg/dL
Microalb Creat Ratio: 1.3 mg/g (ref 0.0–30.0)

## 2012-12-19 LAB — LIPID PANEL
Cholesterol: 147 mg/dL (ref 0–200)
HDL: 56.1 mg/dL (ref >39.00)
LDL Cholesterol: 77 mg/dL (ref 0–99)
Triglycerides: 71 mg/dL (ref 0.0–149.0)

## 2012-12-19 MED ORDER — LISINOPRIL 10 MG PO TABS: 10.0000 mg | ORAL_TABLET | Freq: Every day | ORAL | Status: DC

## 2012-12-19 MED ORDER — SIMVASTATIN 20 MG PO TABS: 20.0000 mg | ORAL_TABLET | Freq: Every day | ORAL | Status: DC

## 2012-12-19 MED ORDER — ZOSTER VACCINE LIVE 19400 UNT/0.65ML ~~LOC~~ SOLR: 0.6500 mL | Freq: Once | SUBCUTANEOUS | Status: DC

## 2012-12-19 MED ORDER — TERAZOSIN HCL 5 MG PO CAPS: ORAL_CAPSULE | ORAL | Status: DC

## 2012-12-19 MED ORDER — FINASTERIDE 5 MG PO TABS: 5.0000 mg | ORAL_TABLET | Freq: Every day | ORAL | Status: DC

## 2012-12-19 NOTE — Assessment & Plan Note (Signed)
BP Readings from Last 3 Encounters:  12/19/12 130/72  07/15/12 118/72  07/03/12 112/78   BP well controlled on current medication. Will check CMP and urine microalbumin with labs.

## 2012-12-19 NOTE — Progress Notes (Signed)
Subjective:    Patient ID: Lucas Black, male    DOB: 1940/10/23, 72 y.o.   MRN: [id number]  HPI The patient is here for annual Medicare wellness examination and management of other chronic and acute problems.   The risk factors are reflected in the social history.  The roster of all physicians providing medical care to patient - is listed in the Snapshot section of the chart.  Activities of daily living:  The patient is 100% independent in all ADLs: dressing, toileting, feeding as well as independent mobility  Home safety : The patient has smoke detectors in the home. They wear seatbelts.  There are locked firearms at home. There is no violence in the home.   There is no risks for hepatitis, STDs or HIV. There is a history of blood transfusion in 1970s during operation for bleeding ulcer. They have no travel history to infectious disease endemic areas of the world.  The patient has not seen their dentist in the last six month. Wears dentures. Dentist - Dr. Nancy Marus They have seen their eye doctor in the last year. (Opthalmology - Dr. Clydene Pugh) No issues with hearing currently. Had hearing aids in past, but does not use them. They have deferred audiologic testing in the last year.    They do not  have excessive sun exposure. Discussed the need for sun protection: hats, long sleeves and use of sunscreen if there is significant sun exposure.   Diet: the importance of a healthy diet is discussed. They do have a healthy diet.  The benefits of regular aerobic exercise were discussed. Stays generally active, walks.  Depression screen: there are no signs or vegative symptoms of depression- irritability, change in appetite, anhedonia, sadness/tearfullness.  Cognitive assessment: the patient manages all their financial and personal affairs and is actively engaged. They could relate day,date,year and events.  HCPOA in place.  The following portions of the patient's history were reviewed and  updated as appropriate: allergies, current medications, past family history, past medical history,  past surgical history, past social history  and problem list.  Visual acuity was not assessed per patient preference since he has regular follow up with her ophthalmologist. Hearing and body mass index were assessed and reviewed.   During the course of the visit the patient was educated and counseled about appropriate screening and preventive services including : fall prevention , diabetes screening, nutrition counseling, colorectal cancer screening, and recommended immunizations.    Patient's only other concern today is some persistent clear nasal drainage over the last few weeks. He occasionally has sneezing with this. He denies any fever, chills, cough, chest pain, shortness of breath.  Outpatient Encounter Prescriptions as of 12/19/2012  Medication Sig Dispense Refill  . aspirin 81 MG tablet Take 81 mg by mouth daily.      . finasteride (PROSCAR) 5 MG tablet Take 1 tablet (5 mg total) by mouth daily.  90 tablet  3  . lisinopril (PRINIVIL,ZESTRIL) 10 MG tablet Take 1 tablet (10 mg total) by mouth daily.  90 tablet  3  . simvastatin (ZOCOR) 20 MG tablet Take 1 tablet (20 mg total) by mouth at bedtime.  90 tablet  3  . terazosin (HYTRIN) 5 MG capsule TAKE 1 CAPSULE AT BEDTIME  90 capsule  3   No facility-administered encounter medications on file as of 12/19/2012.   BP 130/72  Pulse 60  Temp(Src) 97.8 F (36.6 C) (Oral)  Ht 5\' 5"  (1.651 m)  Wt 185 lb (83.915  kg)  BMI 30.79 kg/m2  SpO2 97%   Review of Systems  Constitutional: Negative for fever, chills, activity change, appetite change, fatigue and unexpected weight change.  Eyes: Negative for visual disturbance.  Respiratory: Negative for cough and shortness of breath.   Cardiovascular: Negative for chest pain, palpitations and leg swelling.  Gastrointestinal: Negative for abdominal pain and abdominal distention.  Genitourinary:  Negative for dysuria, urgency and difficulty urinating.  Musculoskeletal: Negative for arthralgias and gait problem.  Skin: Negative for color change and rash.  Hematological: Negative for adenopathy.  Psychiatric/Behavioral: Negative for sleep disturbance and dysphoric mood. The patient is not nervous/anxious.        Objective:   Physical Exam  Constitutional: He is oriented to person, place, and time. He appears well-developed and well-nourished. No distress.  HENT:  Head: Normocephalic and atraumatic.  Right Ear: External ear normal.  Left Ear: External ear normal.  Nose: Nose normal.  Mouth/Throat: Oropharynx is clear and moist. No oropharyngeal exudate.  Eyes: Conjunctivae and EOM are normal. Pupils are equal, round, and reactive to light. Right eye exhibits no discharge. Left eye exhibits no discharge. No scleral icterus.  Neck: Normal range of motion. Neck supple. No tracheal deviation present. No thyromegaly present.  Cardiovascular: Normal rate, regular rhythm and normal heart sounds.  Exam reveals no gallop and no friction rub.   No murmur heard. Pulmonary/Chest: Effort normal and breath sounds normal. No respiratory distress. He has no wheezes. He has no rales. He exhibits no tenderness.  Abdominal: Soft. Bowel sounds are normal. He exhibits no distension and no mass. There is no tenderness. There is no rebound and no guarding.  Musculoskeletal: Normal range of motion. He exhibits no edema.  Lymphadenopathy:    He has no cervical adenopathy.  Neurological: He is alert and oriented to person, place, and time. No cranial nerve deficit. Coordination normal.  Skin: Skin is warm and dry. No rash noted. He is not diaphoretic. No erythema. No pallor.  Psychiatric: He has a normal mood and affect. His behavior is normal. Judgment and thought content normal.          Assessment & Plan:

## 2012-12-19 NOTE — Assessment & Plan Note (Signed)
General medical exam normal today. Health maintenance is UTD including colonoscopy, vaccinations. Flu vaccine given today. Zostavax Rx given. Will check labs including CMP, lipids, PSA. Discussed potential benefits and limitations of PSA screening. Encouraged healthy diet and regular physical activity. Appropriate screening performed.

## 2012-12-25 ENCOUNTER — Encounter: Payer: Medicare Other | Admitting: Internal Medicine

## 2013-03-05 HISTORY — PX: CATARACT EXTRACTION: SUR2

## 2013-04-23 ENCOUNTER — Encounter: Payer: Self-pay | Admitting: Internal Medicine

## 2013-04-23 ENCOUNTER — Ambulatory Visit (INDEPENDENT_AMBULATORY_CARE_PROVIDER_SITE_OTHER): Payer: Medicare Other | Admitting: Internal Medicine

## 2013-04-23 VITALS — BP 130/82 | HR 84 | Temp 98.1°F | Wt 185.0 lb

## 2013-04-23 DIAGNOSIS — J441 Chronic obstructive pulmonary disease with (acute) exacerbation: Secondary | ICD-10-CM

## 2013-04-23 MED ORDER — LEVOFLOXACIN 500 MG PO TABS: 500.0000 mg | ORAL_TABLET | Freq: Every day | ORAL | Status: DC

## 2013-04-23 MED ORDER — PREDNISONE 10 MG PO TABS: ORAL_TABLET | ORAL | Status: DC

## 2013-04-23 NOTE — Assessment & Plan Note (Signed)
Symptoms and exam are consistent with COPD exacerbation. Will start Levaquin and prednisone taper. Pt will continue Symbicort and prn Albuterol. Follow up if symptoms are not improving.

## 2013-04-23 NOTE — Progress Notes (Signed)
   Subjective:    Patient ID: Lucas Black, male    DOB: [birth year], 73 y.o.   MRN: [id number]  HPI 73YO male with COPD presents for acute visit.  Complains of nasal congestion, cough productive of purulent mucous x 1 week. Denies shortness of breath, chest pain, fever. Taking inhalers as prescribed, no improvement.  Review of Systems  Constitutional: Negative for fever, chills, activity change and fatigue.  HENT: Positive for congestion, postnasal drip and rhinorrhea. Negative for ear discharge, ear pain, hearing loss, nosebleeds, sinus pressure, sneezing, sore throat, tinnitus, trouble swallowing and voice change.   Eyes: Negative for discharge, redness, itching and visual disturbance.  Respiratory: Positive for cough and wheezing. Negative for chest tightness, shortness of breath and stridor.   Cardiovascular: Negative for chest pain and leg swelling.  Musculoskeletal: Negative for arthralgias, myalgias, neck pain and neck stiffness.  Skin: Negative for color change and rash.  Neurological: Negative for dizziness, facial asymmetry and headaches.  Psychiatric/Behavioral: Negative for sleep disturbance.       Objective:    BP 130/82  Pulse 84  Temp(Src) 98.1 F (36.7 C) (Oral)  Wt 185 lb (83.915 kg)  SpO2 97% Physical Exam  Constitutional: He is oriented to person, place, and time. He appears well-developed and well-nourished. No distress.  HENT:  Head: Normocephalic and atraumatic.  Right Ear: External ear normal.  Left Ear: External ear normal.  Nose: Nose normal.  Mouth/Throat: Oropharynx is clear and moist. No oropharyngeal exudate.  Eyes: Conjunctivae and EOM are normal. Pupils are equal, round, and reactive to light. Right eye exhibits no discharge. Left eye exhibits no discharge. No scleral icterus.  Neck: Normal range of motion. Neck supple. No tracheal deviation present. No thyromegaly present.  Cardiovascular: Normal rate, regular rhythm and normal heart sounds.  Exam  reveals no gallop and no friction rub.   No murmur heard. Pulmonary/Chest: Effort normal. No accessory muscle usage. Not tachypneic. No respiratory distress. He has decreased breath sounds (prolonged expiration). He has no wheezes. He has rhonchi in the right middle field and the right lower field. He has no rales. He exhibits no tenderness.  Musculoskeletal: Normal range of motion. He exhibits no edema.  Lymphadenopathy:    He has no cervical adenopathy.  Neurological: He is alert and oriented to person, place, and time. No cranial nerve deficit. Coordination normal.  Skin: Skin is warm and dry. No rash noted. He is not diaphoretic. No erythema. No pallor.  Psychiatric: He has a normal mood and affect. His behavior is normal. Judgment and thought content normal.          Assessment & Plan:   Problem List Items Addressed This Visit   COPD exacerbation - Primary     Symptoms and exam are consistent with COPD exacerbation. Will start Levaquin and prednisone taper. Pt will continue Symbicort and prn Albuterol. Follow up if symptoms are not improving.    Relevant Medications      levofloxacin (LEVAQUIN) tablet      predniSONE (DELTASONE) tablet       Return if symptoms worsen or fail to improve.

## 2013-04-23 NOTE — Progress Notes (Signed)
Pre-visit discussion using our clinic review tool. No additional management support is needed unless otherwise documented below in the visit note.  

## 2013-04-23 NOTE — Patient Instructions (Signed)
Start Prednisone taper and daily Levaquin. Call or email if symptoms are not improving.

## 2013-04-24 ENCOUNTER — Telehealth: Payer: Self-pay | Admitting: Internal Medicine

## 2013-04-24 NOTE — Telephone Encounter (Signed)
Relevant patient education assigned to patient using Emmi. ° °

## 2013-05-07 DIAGNOSIS — H04129 Dry eye syndrome of unspecified lacrimal gland: Secondary | ICD-10-CM | POA: Diagnosis not present

## 2013-05-07 DIAGNOSIS — H25019 Cortical age-related cataract, unspecified eye: Secondary | ICD-10-CM | POA: Diagnosis not present

## 2013-05-07 DIAGNOSIS — H524 Presbyopia: Secondary | ICD-10-CM | POA: Diagnosis not present

## 2013-05-07 DIAGNOSIS — H2510 Age-related nuclear cataract, unspecified eye: Secondary | ICD-10-CM | POA: Diagnosis not present

## 2013-05-26 DIAGNOSIS — H269 Unspecified cataract: Secondary | ICD-10-CM | POA: Diagnosis not present

## 2013-05-26 DIAGNOSIS — H259 Unspecified age-related cataract: Secondary | ICD-10-CM | POA: Diagnosis not present

## 2013-05-26 DIAGNOSIS — H2510 Age-related nuclear cataract, unspecified eye: Secondary | ICD-10-CM | POA: Diagnosis not present

## 2013-06-23 ENCOUNTER — Ambulatory Visit (INDEPENDENT_AMBULATORY_CARE_PROVIDER_SITE_OTHER): Payer: Medicare Other | Admitting: Internal Medicine

## 2013-06-23 ENCOUNTER — Encounter: Payer: Self-pay | Admitting: Internal Medicine

## 2013-06-23 VITALS — BP 130/70 | HR 61 | Temp 97.7°F | Ht 65.0 in | Wt 185.0 lb

## 2013-06-23 DIAGNOSIS — J309 Allergic rhinitis, unspecified: Secondary | ICD-10-CM

## 2013-06-23 DIAGNOSIS — J4489 Other specified chronic obstructive pulmonary disease: Secondary | ICD-10-CM

## 2013-06-23 DIAGNOSIS — Z23 Encounter for immunization: Secondary | ICD-10-CM

## 2013-06-23 DIAGNOSIS — J449 Chronic obstructive pulmonary disease, unspecified: Secondary | ICD-10-CM

## 2013-06-23 DIAGNOSIS — I10 Essential (primary) hypertension: Secondary | ICD-10-CM

## 2013-06-23 DIAGNOSIS — E785 Hyperlipidemia, unspecified: Secondary | ICD-10-CM | POA: Diagnosis not present

## 2013-06-23 LAB — COMPREHENSIVE METABOLIC PANEL
ALBUMIN: 3.9 g/dL (ref 3.5–5.2)
ALK PHOS: 59 U/L (ref 39–117)
ALT: 14 U/L (ref 0–53)
AST: 18 U/L (ref 0–37)
BILIRUBIN TOTAL: 0.8 mg/dL (ref 0.3–1.2)
BUN: 18 mg/dL (ref 6–23)
CO2: 28 mEq/L (ref 19–32)
Calcium: 9.8 mg/dL (ref 8.4–10.5)
Chloride: 107 mEq/L (ref 96–112)
Creatinine, Ser: 1.1 mg/dL (ref 0.4–1.5)
GFR: 69.6 mL/min (ref >60.00)
GLUCOSE: 105 mg/dL — AB (ref 70–99)
Potassium: 4.6 mEq/L (ref 3.5–5.1)
Sodium: 140 mEq/L (ref 135–145)
Total Protein: 6.8 g/dL (ref 6.0–8.3)

## 2013-06-23 LAB — LIPID PANEL
CHOLESTEROL: 146 mg/dL (ref 0–200)
HDL: 51.5 mg/dL (ref >39.00)
LDL Cholesterol: 82 mg/dL (ref 0–99)
TRIGLYCERIDES: 63 mg/dL (ref 0.0–149.0)
Total CHOL/HDL Ratio: 3
VLDL: 12.6 mg/dL (ref 0.0–40.0)

## 2013-06-23 NOTE — Assessment & Plan Note (Signed)
BP Readings from Last 3 Encounters:  06/23/13 130/70  04/23/13 130/82  12/19/12 130/72   BP well controlled. Continue current medications.

## 2013-06-23 NOTE — Assessment & Plan Note (Signed)
Will check lipids and LFTs  with labs today. Continue Simvastatin. 

## 2013-06-23 NOTE — Progress Notes (Signed)
Subjective:    Patient ID: Lucas Black, male    DOB: Jun 17, 1940, 73 y.o.   MRN: [id number]  HPI 73YO male presents for follow up.  HTN - Compliant with meds. No recent chest pain, headache.  Notes recent increase in allergies with some clear nasal drainage and congestion. Taking OTC allergy medication with some improvement. No fever, chills, dyspnea, wheezing.  Review of Systems  Constitutional: Negative for fever, chills, activity change, appetite change, fatigue and unexpected weight change.  Eyes: Negative for visual disturbance.  Respiratory: Negative for cough and shortness of breath.   Cardiovascular: Negative for chest pain, palpitations and leg swelling.  Gastrointestinal: Negative for abdominal pain and abdominal distention.  Genitourinary: Negative for dysuria, urgency and difficulty urinating.  Musculoskeletal: Negative for arthralgias and gait problem.  Skin: Negative for color change and rash.  Hematological: Negative for adenopathy.  Psychiatric/Behavioral: Negative for sleep disturbance and dysphoric mood. The patient is not nervous/anxious.        Objective:    BP 130/70  Pulse 61  Temp(Src) 97.7 F (36.5 C) (Oral)  Ht 5\' 5"  (1.651 m)  Wt 185 lb (83.915 kg)  BMI 30.79 kg/m2  SpO2 97% Physical Exam  Constitutional: He is oriented to person, place, and time. He appears well-developed and well-nourished. No distress.  HENT:  Head: Normocephalic and atraumatic.  Right Ear: External ear normal.  Left Ear: External ear normal.  Nose: Nose normal.  Mouth/Throat: Oropharynx is clear and moist. No oropharyngeal exudate.  Eyes: Conjunctivae and EOM are normal. Pupils are equal, round, and reactive to light. Right eye exhibits no discharge. Left eye exhibits no discharge. No scleral icterus.  Neck: Normal range of motion. Neck supple. No tracheal deviation present. No thyromegaly present.  Cardiovascular: Normal rate, regular rhythm and normal heart sounds.  Exam  reveals no gallop and no friction rub.   No murmur heard. Pulmonary/Chest: Effort normal and breath sounds normal. No accessory muscle usage. Not tachypneic. No respiratory distress. He has no decreased breath sounds. He has no wheezes. He has no rhonchi. He has no rales. He exhibits no tenderness.  Musculoskeletal: Normal range of motion. He exhibits no edema.  Lymphadenopathy:    He has no cervical adenopathy.  Neurological: He is alert and oriented to person, place, and time. No cranial nerve deficit. Coordination normal.  Skin: Skin is warm and dry. No rash noted. He is not diaphoretic. No erythema. No pallor.  Psychiatric: He has a normal mood and affect. His behavior is normal. Judgment and thought content normal.          Assessment & Plan:   Problem List Items Addressed This Visit   Allergic rhinitis     Recent increased symptoms of clear nasal drainage and congestion. Will continue OTC non-sedating antihistamine such as claritin and nasal steroid. Suggested adding Benadryl at night if symptoms persistent.    COPD (chronic obstructive pulmonary disease)     Symptomatically doing well. Will continue Symbicort and prn albuterol.    Hyperlipidemia     Will check lipids and LFTs with labs today. Continue Simvastatin.    Relevant Orders      Comprehensive metabolic panel      Lipid panel   Hypertension - Primary      BP Readings from Last 3 Encounters:  06/23/13 130/70  04/23/13 130/82  12/19/12 130/72   BP well controlled. Continue current medications.    Relevant Orders      Comprehensive metabolic panel  Return in about 6 months (around 12/23/2013) for Wellness Visit.

## 2013-06-23 NOTE — Addendum Note (Signed)
Addended by: Ronaldo Miyamoto on: 06/23/2013 08:36 AM   Modules accepted: Orders, Medications

## 2013-06-23 NOTE — Assessment & Plan Note (Signed)
Recent increased symptoms of clear nasal drainage and congestion. Will continue OTC non-sedating antihistamine such as claritin and nasal steroid. Suggested adding Benadryl at night if symptoms persistent.

## 2013-06-23 NOTE — Assessment & Plan Note (Signed)
Symptomatically doing well. Will continue Symbicort and prn albuterol.

## 2013-06-23 NOTE — Progress Notes (Signed)
Pre visit review using our clinic review tool, if applicable. No additional management support is needed unless otherwise documented below in the visit note. 

## 2013-06-24 ENCOUNTER — Telehealth: Payer: Self-pay | Admitting: Internal Medicine

## 2013-06-24 NOTE — Telephone Encounter (Signed)
Relevant patient education assigned to patient using Emmi. ° °

## 2013-06-26 DIAGNOSIS — H25019 Cortical age-related cataract, unspecified eye: Secondary | ICD-10-CM | POA: Diagnosis not present

## 2013-06-26 DIAGNOSIS — H2510 Age-related nuclear cataract, unspecified eye: Secondary | ICD-10-CM | POA: Diagnosis not present

## 2013-07-07 DIAGNOSIS — H259 Unspecified age-related cataract: Secondary | ICD-10-CM | POA: Diagnosis not present

## 2013-07-07 DIAGNOSIS — H269 Unspecified cataract: Secondary | ICD-10-CM | POA: Diagnosis not present

## 2013-07-07 DIAGNOSIS — H2510 Age-related nuclear cataract, unspecified eye: Secondary | ICD-10-CM | POA: Diagnosis not present

## 2013-11-23 ENCOUNTER — Other Ambulatory Visit: Payer: Self-pay | Admitting: Internal Medicine

## 2013-12-23 ENCOUNTER — Encounter: Payer: Self-pay | Admitting: Internal Medicine

## 2013-12-23 ENCOUNTER — Ambulatory Visit (INDEPENDENT_AMBULATORY_CARE_PROVIDER_SITE_OTHER): Payer: Medicare Other | Admitting: Internal Medicine

## 2013-12-23 VITALS — BP 132/74 | HR 62 | Temp 98.0°F | Ht 66.5 in | Wt 187.0 lb

## 2013-12-23 DIAGNOSIS — Z23 Encounter for immunization: Secondary | ICD-10-CM

## 2013-12-23 DIAGNOSIS — I10 Essential (primary) hypertension: Secondary | ICD-10-CM

## 2013-12-23 DIAGNOSIS — L989 Disorder of the skin and subcutaneous tissue, unspecified: Secondary | ICD-10-CM

## 2013-12-23 DIAGNOSIS — N400 Enlarged prostate without lower urinary tract symptoms: Secondary | ICD-10-CM

## 2013-12-23 DIAGNOSIS — Z0000 Encounter for general adult medical examination without abnormal findings: Secondary | ICD-10-CM | POA: Diagnosis not present

## 2013-12-23 DIAGNOSIS — E785 Hyperlipidemia, unspecified: Secondary | ICD-10-CM

## 2013-12-23 LAB — LIPID PANEL
Cholesterol: 144 mg/dL (ref 0–200)
HDL: 55.2 mg/dL (ref >39.00)
LDL Cholesterol: 74 mg/dL (ref 0–99)
NonHDL: 88.8
TRIGLYCERIDES: 72 mg/dL (ref 0.0–149.0)
Total CHOL/HDL Ratio: 3
VLDL: 14.4 mg/dL (ref 0.0–40.0)

## 2013-12-23 LAB — COMPREHENSIVE METABOLIC PANEL
ALT: 13 U/L (ref 0–53)
AST: 21 U/L (ref 0–37)
Albumin: 3.8 g/dL (ref 3.5–5.2)
Alkaline Phosphatase: 70 U/L (ref 39–117)
BILIRUBIN TOTAL: 0.9 mg/dL (ref 0.2–1.2)
BUN: 18 mg/dL (ref 6–23)
CHLORIDE: 106 meq/L (ref 96–112)
CO2: 25 mEq/L (ref 19–32)
CREATININE: 1.3 mg/dL (ref 0.4–1.5)
Calcium: 10.1 mg/dL (ref 8.4–10.5)
GFR: 58.36 mL/min — AB (ref >60.00)
Glucose, Bld: 93 mg/dL (ref 70–99)
Potassium: 4.5 mEq/L (ref 3.5–5.1)
Sodium: 141 mEq/L (ref 135–145)
Total Protein: 6.9 g/dL (ref 6.0–8.3)

## 2013-12-23 LAB — CBC WITH DIFFERENTIAL/PLATELET
BASOS PCT: 0.4 % (ref 0.0–3.0)
Basophils Absolute: 0 10*3/uL (ref 0.0–0.1)
EOS ABS: 0.1 10*3/uL (ref 0.0–0.7)
Eosinophils Relative: 2.6 % (ref 0.0–5.0)
HCT: 40.7 % (ref 39.0–52.0)
Hemoglobin: 13.5 g/dL (ref 13.0–17.0)
LYMPHS PCT: 29.9 % (ref 12.0–46.0)
Lymphs Abs: 1.4 10*3/uL (ref 0.7–4.0)
MCHC: 33.2 g/dL (ref 30.0–36.0)
MCV: 90.1 fl (ref 78.0–100.0)
Monocytes Absolute: 0.5 10*3/uL (ref 0.1–1.0)
Monocytes Relative: 10.7 % (ref 3.0–12.0)
NEUTROS ABS: 2.7 10*3/uL (ref 1.4–7.7)
Neutrophils Relative %: 56.4 % (ref 43.0–77.0)
Platelets: 169 10*3/uL (ref 150.0–400.0)
RBC: 4.51 Mil/uL (ref 4.22–5.81)
RDW: 13.9 % (ref 11.5–15.5)
WBC: 4.8 10*3/uL (ref 4.0–10.5)

## 2013-12-23 LAB — PSA, MEDICARE: PSA: 0.33 ng/ml (ref 0.10–4.00)

## 2013-12-23 LAB — MICROALBUMIN / CREATININE URINE RATIO
Creatinine,U: 58.7 mg/dL
MICROALB/CREAT RATIO: 0.3 mg/g (ref 0.0–30.0)
Microalb, Ur: 0.2 mg/dL (ref 0.0–1.9)

## 2013-12-23 NOTE — Progress Notes (Signed)
Subjective:    Patient ID: Lucas Black, male    DOB: 02-05-1941, 73 y.o.   MRN: [id number]  HPI The patient is here for annual Medicare wellness examination and management of other chronic and acute problems.   The risk factors are reflected in the social history.  The roster of all physicians providing medical care to patient - is listed in the Snapshot section of the chart.  Activities of daily living:  The patient is 100% independent in all ADLs: dressing, toileting, feeding as well as independent mobility. Lives in a home with wife. Has hardwood floors.  Home safety : The patient has smoke detectors in the home. They wear seatbelts.  There are locked firearms at home. There is no violence in the home.   There is no risks for hepatitis, STDs or HIV. There is a history of blood transfusion in 1970s during operation for bleeding ulcer. They have no travel history to infectious disease endemic areas of the world.  The patient has not seen their dentist in the last six month. Wears dentures. Dentist - Dr. Caryn Bee They have seen their eye doctor in the last year. Last March had cataract removal. (Opthalmology - Dr. Ellin Mayhew) No issues with hearing currently. Had hearing aids in past, but does not use them. They have deferred audiologic testing in the last year.    They do not  have excessive sun exposure. Discussed the need for sun protection: hats, long sleeves and use of sunscreen if there is significant sun exposure.   Diet: the importance of a healthy diet is discussed. They do have a healthy diet.  The benefits of regular aerobic exercise were discussed. Stays generally active, walks.  Depression screen: there are no signs or vegative symptoms of depression- irritability, change in appetite, anhedonia, sadness/tearfullness.  Cognitive assessment: the patient manages all their financial and personal affairs and is actively engaged. They could relate day,date,year and events.  HCPOA  in place.  The following portions of the patient's history were reviewed and updated as appropriate: allergies, current medications, past family history, past medical history,  past surgical history, past social history  and problem list.  Visual acuity was not assessed per patient preference since he has regular follow up with her ophthalmologist. Hearing and body mass index were assessed and reviewed.   During the course of the visit the patient was educated and counseled about appropriate screening and preventive services including : fall prevention , diabetes screening, nutrition counseling, colorectal cancer screening, and recommended immunizations.    Concerned about rough area on left upper ear. Present for about 1 year.  Seems to be getting larger. Hurts when he lies on his left side at night.   Review of Systems  Constitutional: Negative for fever, chills, activity change, appetite change, fatigue and unexpected weight change.  Eyes: Negative for visual disturbance.  Respiratory: Negative for cough, shortness of breath and wheezing.   Cardiovascular: Negative for chest pain, palpitations and leg swelling.  Gastrointestinal: Negative for nausea, vomiting, abdominal pain, diarrhea, constipation and abdominal distention.  Genitourinary: Negative for dysuria, urgency and difficulty urinating.  Musculoskeletal: Negative for arthralgias and gait problem.  Skin: Negative for color change and rash.  Hematological: Negative for adenopathy.  Psychiatric/Behavioral: Negative for sleep disturbance and dysphoric mood. The patient is not nervous/anxious.        Objective:    BP 132/74  Pulse 62  Temp(Src) 98 F (36.7 C) (Oral)  Ht 5' 6.5" (1.689 m)  Wt 187 lb (84.823 kg)  BMI 29.73 kg/m2  SpO2 97% Physical Exam  Constitutional: He is oriented to person, place, and time. He appears well-developed and well-nourished. No distress.  HENT:  Head: Normocephalic and atraumatic.    Right  Ear: External ear normal.  Left Ear: External ear normal.  Nose: Nose normal.  Mouth/Throat: Oropharynx is clear and moist. No oropharyngeal exudate.  Eyes: Conjunctivae and EOM are normal. Pupils are equal, round, and reactive to light. Right eye exhibits no discharge. Left eye exhibits no discharge. No scleral icterus.  Neck: Normal range of motion. Neck supple. No tracheal deviation present. No thyromegaly present.  Cardiovascular: Normal rate, regular rhythm and normal heart sounds.  Exam reveals no gallop and no friction rub.   No murmur heard. Pulmonary/Chest: Effort normal and breath sounds normal. No respiratory distress. He has no wheezes. He has no rales. He exhibits no tenderness.  Abdominal: Soft. Bowel sounds are normal. He exhibits no distension and no mass. There is no tenderness. There is no rebound and no guarding.  Musculoskeletal: Normal range of motion. He exhibits no edema.  Lymphadenopathy:    He has no cervical adenopathy.  Neurological: He is alert and oriented to person, place, and time. No cranial nerve deficit. Coordination normal.  Skin: Skin is warm and dry. No rash noted. He is not diaphoretic. No erythema. No pallor.  Psychiatric: He has a normal mood and affect. His behavior is normal. Judgment and thought content normal.          Assessment & Plan:   Problem List Items Addressed This Visit     Unprioritized   Medicare annual wellness visit, subsequent - Primary     General medical exam normal today. Colonoscopy UTD. Will check labs today including PSA, CBC, CMP, lipids. Flu vaccine today. Encouraged healthy diet and exercise.    Relevant Orders      PSA, Medicare      CBC with Differential      Comprehensive metabolic panel      Lipid panel      Microalbumin / creatinine urine ratio   Skin lesion     Skin lesion left upper ear most consistent with AK or SCC. Will set up evaluation with dermatology for biopsy.    Relevant Orders      Ambulatory  referral to Dermatology    Other Visit Diagnoses   Encounter for immunization            Return in about 6 months (around 06/24/2014) for Recheck.

## 2013-12-23 NOTE — Patient Instructions (Signed)

## 2013-12-23 NOTE — Assessment & Plan Note (Signed)
General medical exam normal today. Colonoscopy UTD. Will check labs today including PSA, CBC, CMP, lipids. Flu vaccine today. Encouraged healthy diet and exercise.

## 2013-12-23 NOTE — Assessment & Plan Note (Signed)
Skin lesion left upper ear most consistent with AK or SCC. Will set up evaluation with dermatology for biopsy.

## 2013-12-23 NOTE — Progress Notes (Signed)
Pre visit review using our clinic review tool, if applicable. No additional management support is needed unless otherwise documented below in the visit note. 

## 2014-01-25 DIAGNOSIS — L821 Other seborrheic keratosis: Secondary | ICD-10-CM | POA: Diagnosis not present

## 2014-01-25 DIAGNOSIS — L578 Other skin changes due to chronic exposure to nonionizing radiation: Secondary | ICD-10-CM | POA: Diagnosis not present

## 2014-01-25 DIAGNOSIS — D485 Neoplasm of uncertain behavior of skin: Secondary | ICD-10-CM | POA: Diagnosis not present

## 2014-01-25 DIAGNOSIS — H61002 Unspecified perichondritis of left external ear: Secondary | ICD-10-CM | POA: Diagnosis not present

## 2014-01-25 DIAGNOSIS — L570 Actinic keratosis: Secondary | ICD-10-CM | POA: Diagnosis not present

## 2014-01-25 DIAGNOSIS — C44319 Basal cell carcinoma of skin of other parts of face: Secondary | ICD-10-CM | POA: Diagnosis not present

## 2014-01-25 DIAGNOSIS — L820 Inflamed seborrheic keratosis: Secondary | ICD-10-CM | POA: Diagnosis not present

## 2014-02-08 DIAGNOSIS — C44319 Basal cell carcinoma of skin of other parts of face: Secondary | ICD-10-CM | POA: Diagnosis not present

## 2014-02-08 DIAGNOSIS — H61032 Chondritis of left external ear: Secondary | ICD-10-CM | POA: Diagnosis not present

## 2014-03-08 ENCOUNTER — Other Ambulatory Visit: Payer: Self-pay | Admitting: Internal Medicine

## 2014-03-08 MED ORDER — TERAZOSIN HCL 5 MG PO CAPS: 5.0000 mg | ORAL_CAPSULE | Freq: Every day | ORAL | Status: DC

## 2014-03-12 ENCOUNTER — Other Ambulatory Visit: Payer: Self-pay

## 2014-03-12 MED ORDER — TERAZOSIN HCL 5 MG PO CAPS: 5.0000 mg | ORAL_CAPSULE | Freq: Every day | ORAL | Status: DC

## 2014-03-12 NOTE — Telephone Encounter (Signed)
Patient's wife sent mychart message requesting a 7 day short term Rx been sent to local pharmacy. Rx was sent to mail order pharmacy on 03/08/14 but patient is completely out of medication right now. Wife stated that insurance ok'ed short term supply.

## 2014-03-20 ENCOUNTER — Other Ambulatory Visit: Payer: Self-pay | Admitting: Internal Medicine

## 2014-04-09 DIAGNOSIS — C44319 Basal cell carcinoma of skin of other parts of face: Secondary | ICD-10-CM | POA: Diagnosis not present

## 2014-04-09 DIAGNOSIS — Z85828 Personal history of other malignant neoplasm of skin: Secondary | ICD-10-CM | POA: Insufficient documentation

## 2014-04-12 DIAGNOSIS — Z85828 Personal history of other malignant neoplasm of skin: Secondary | ICD-10-CM | POA: Diagnosis not present

## 2014-04-12 DIAGNOSIS — L570 Actinic keratosis: Secondary | ICD-10-CM | POA: Diagnosis not present

## 2014-04-12 DIAGNOSIS — D1800 Hemangioma unspecified site: Secondary | ICD-10-CM | POA: Diagnosis not present

## 2014-04-12 DIAGNOSIS — L814 Other melanin hyperpigmentation: Secondary | ICD-10-CM | POA: Diagnosis not present

## 2014-04-12 DIAGNOSIS — L578 Other skin changes due to chronic exposure to nonionizing radiation: Secondary | ICD-10-CM | POA: Diagnosis not present

## 2014-04-12 DIAGNOSIS — L821 Other seborrheic keratosis: Secondary | ICD-10-CM | POA: Diagnosis not present

## 2014-04-19 ENCOUNTER — Ambulatory Visit (INDEPENDENT_AMBULATORY_CARE_PROVIDER_SITE_OTHER): Payer: Medicare Other | Admitting: Nurse Practitioner

## 2014-04-19 ENCOUNTER — Ambulatory Visit (INDEPENDENT_AMBULATORY_CARE_PROVIDER_SITE_OTHER)
Admission: RE | Admit: 2014-04-19 | Discharge: 2014-04-19 | Disposition: A | Discharge status: Home / Self Care | Payer: Medicare Other | Source: Ambulatory Visit | Attending: Nurse Practitioner | Admitting: Nurse Practitioner

## 2014-04-19 ENCOUNTER — Encounter: Payer: Self-pay | Admitting: Nurse Practitioner

## 2014-04-19 VITALS — BP 130/70 | HR 85 | Temp 98.3°F | Resp 12 | Ht 65.0 in | Wt 187.8 lb

## 2014-04-19 DIAGNOSIS — M898X1 Other specified disorders of bone, shoulder: Secondary | ICD-10-CM | POA: Diagnosis not present

## 2014-04-19 DIAGNOSIS — M542 Cervicalgia: Secondary | ICD-10-CM

## 2014-04-19 DIAGNOSIS — M47812 Spondylosis without myelopathy or radiculopathy, cervical region: Secondary | ICD-10-CM | POA: Diagnosis not present

## 2014-04-19 DIAGNOSIS — G542 Cervical root disorders, not elsewhere classified: Secondary | ICD-10-CM | POA: Insufficient documentation

## 2014-04-19 DIAGNOSIS — M19011 Primary osteoarthritis, right shoulder: Secondary | ICD-10-CM | POA: Diagnosis not present

## 2014-04-19 NOTE — Assessment & Plan Note (Signed)
Stable. Pt stated he likes to keep right arm elevated and had pain with neck flexion. He denies radiating pain down either arm. Will obtain Neck and right shoulder x-rays. Suspect more neck etiology than shoulder. Pt okay to continue Aleve he has not had gastric ulcer issues since 1970. Asked him to stop the Aleve if there is stomach pain/burning and antacids are good to have on board to protect the stomach. FU in 4 weeks.

## 2014-04-19 NOTE — Progress Notes (Signed)
Pre visit review using our clinic review tool, if applicable. No additional management support is needed unless otherwise documented below in the visit note. 

## 2014-04-19 NOTE — Assessment & Plan Note (Signed)
Slightly improving. Will obtain Shoulder x-ray. Ice and Aleve recommended. Suspect more neck than shoulder issues. FU in 4 weeks.

## 2014-04-19 NOTE — Patient Instructions (Signed)
[facility] Practice Address:  Address: [address], [place], [place] [address]  [phone] X-rays until 4:15 pm.

## 2014-04-19 NOTE — Progress Notes (Signed)
Subjective:    Patient ID: Lucas Black, male    DOB: 02/25/1941, 74 y.o.   MRN: [id number]  HPI  [name] is a 74 yo male with a CC of right shoulder pain.   1) Onset- 2-3 weeks ago Location- Right shoulder under shoulder blade Duration- slightly improving, constant pain Characteristics- Sharp when at worst, sore the rest of the time  Aggrevating factors- Lying down, pressure on right shoulder Relieving factors- Aleve, Ice and heat somewhat helpful  Severity- Worst- 7/10, Best- 3/10   No falls or trauma After doing ROM of shoulders and neck- pt states it feels better to have his right arm elevated.    Review of Systems  Constitutional: Negative for fever, chills, diaphoresis, activity change and fatigue.  Eyes: Negative for visual disturbance.  Respiratory: Negative for chest tightness, shortness of breath and wheezing.   Gastrointestinal: Negative for nausea, vomiting, abdominal pain, diarrhea and abdominal distention.       Denies reflux  Genitourinary: Negative for dysuria and difficulty urinating.  Musculoskeletal: Positive for back pain and arthralgias. Negative for myalgias, joint swelling, gait problem, neck pain and neck stiffness.       Right shoulder blade pain   Skin: Negative for rash.  Neurological: Negative for dizziness, weakness and numbness.   Past Medical History  Diagnosis Date  . Hyperlipidemia   . Hypertension   . Screening for colon cancer 2011    colonoscopy normal    History   Social History  . Marital Status: Married    Spouse Name: N/A  . Number of Children: N/A  . Years of Education: N/A   Occupational History  . Not on file.   Social History Main Topics  . Smoking status: Former Smoker    Quit date: 03/31/1984  . Smokeless tobacco: Never Used  . Alcohol Use: Yes     Comment: rarely  . Drug Use: No  . Sexual Activity: Not on file   Other Topics Concern  . Not on file   Social History Narrative    Past Surgical History    Procedure Laterality Date  . Gastric resection  1973    secondary to ulcer  . Prostate surgery    . Cataract extraction Left 2015    Family History  Problem Relation Age of Onset  . Cancer Mother     colon cancer  . Heart disease Father   . Heart disease Brother   . Cancer Brother     skin    No Known Allergies  Current Outpatient Prescriptions on File Prior to Visit  Medication Sig Dispense Refill  . aspirin 81 MG tablet Take 81 mg by mouth daily.    . finasteride (PROSCAR) 5 MG tablet TAKE 1 TABLET DAILY 90 tablet 2  . fluticasone (FLONASE) 50 MCG/ACT nasal spray Place 2 sprays into the nose daily. 16 g 0  . lisinopril (PRINIVIL,ZESTRIL) 10 MG tablet TAKE 1 TABLET DAILY 90 tablet 2  . simvastatin (ZOCOR) 20 MG tablet TAKE 1 TABLET AT BEDTIME 90 tablet 2  . terazosin (HYTRIN) 5 MG capsule TAKE ONE CAPSULE BY MOUTH AT BEDTIME 7 capsule 0  . albuterol (PROVENTIL HFA;VENTOLIN HFA) 108 (90 BASE) MCG/ACT inhaler Inhale 2 puffs into the lungs every 6 (six) hours as needed for wheezing. 3 Inhaler 4  . budesonide-formoterol (SYMBICORT) 160-4.5 MCG/ACT inhaler Inhale 2 puffs into the lungs 2 (two) times daily. 3 Inhaler 4   No current facility-administered medications on file prior to  visit.      Objective:   Physical Exam  Constitutional: He is oriented to person, place, and time. He appears well-developed and well-nourished. No distress.  BP 130/70 mmHg  Pulse 85  Temp(Src) 98.3 F (36.8 C) (Oral)  Resp 12  Ht 5\' 5"  (1.651 m)  Wt 187 lb 12.8 oz (85.186 kg)  BMI 31.25 kg/m2  SpO2 95%   HENT:  Head: Normocephalic and atraumatic.  Right Ear: External ear normal.  Left Ear: External ear normal.  Eyes: Right eye exhibits no discharge. Left eye exhibits no discharge. No scleral icterus.  Neck: Normal range of motion. Neck supple.  Tenderness of cervical spine  Cardiovascular: Normal rate, regular rhythm, normal heart sounds and intact distal pulses.  Exam reveals no gallop  and no friction rub.   No murmur heard. Pulmonary/Chest: Effort normal and breath sounds normal. No respiratory distress. He has no wheezes. He has no rales. He exhibits no tenderness.  Musculoskeletal: Normal range of motion. He exhibits no edema or tenderness.  Full ROM of shoulders, negative impingement, negative empty can, negative yergason's. Point tenderness of the medial right shoulder blade.   Neurological: He is alert and oriented to person, place, and time.  Skin: Skin is warm and dry. No rash noted. He is not diaphoretic.  Psychiatric: He has a normal mood and affect. His behavior is normal. Judgment and thought content normal.       Assessment & Plan:

## 2014-04-20 ENCOUNTER — Encounter: Payer: Self-pay | Admitting: Internal Medicine

## 2014-04-20 ENCOUNTER — Telehealth: Payer: Self-pay | Admitting: Nurse Practitioner

## 2014-04-20 MED ORDER — MELOXICAM 15 MG PO TABS: 15.0000 mg | ORAL_TABLET | Freq: Every day | ORAL | Status: DC

## 2014-04-20 NOTE — Telephone Encounter (Signed)
Telephone note

## 2014-04-26 ENCOUNTER — Ambulatory Visit (INDEPENDENT_AMBULATORY_CARE_PROVIDER_SITE_OTHER)
Admission: RE | Admit: 2014-04-26 | Discharge: 2014-04-26 | Disposition: A | Discharge status: Home / Self Care | Payer: Medicare Other | Source: Ambulatory Visit | Attending: Internal Medicine | Admitting: Internal Medicine

## 2014-04-26 ENCOUNTER — Encounter: Payer: Self-pay | Admitting: Internal Medicine

## 2014-04-26 ENCOUNTER — Ambulatory Visit (INDEPENDENT_AMBULATORY_CARE_PROVIDER_SITE_OTHER): Payer: Medicare Other | Admitting: Internal Medicine

## 2014-04-26 VITALS — BP 149/81 | HR 62 | Temp 97.9°F | Ht 65.0 in | Wt 188.0 lb

## 2014-04-26 DIAGNOSIS — R0789 Other chest pain: Secondary | ICD-10-CM

## 2014-04-26 DIAGNOSIS — J449 Chronic obstructive pulmonary disease, unspecified: Secondary | ICD-10-CM | POA: Diagnosis not present

## 2014-04-26 DIAGNOSIS — I10 Essential (primary) hypertension: Secondary | ICD-10-CM | POA: Diagnosis not present

## 2014-04-26 MED ORDER — CYCLOBENZAPRINE HCL 5 MG PO TABS: 5.0000 mg | ORAL_TABLET | Freq: Three times a day (TID) | ORAL | Status: DC | PRN

## 2014-04-26 NOTE — Assessment & Plan Note (Signed)
Right posterior chest wall pain with palpable muscular spasm right latissimus. Will start Flexeril. Discussed using TENS unit. Discussed massage therapy. Follow up recheck in 2-3 days. If no improvement then will set up sports med evaluation.

## 2014-04-26 NOTE — Progress Notes (Signed)
   Subjective:    Patient ID: Lucas Black, male    DOB: [birth year], 74 y.o.   MRN: [id number]  HPI  74YO male presents for acute visit.  Shoulder pain - Started 3 weeks ago. Right shoulder. Actually described as pain starting under right shoulder blade and radiating around under right shoulder. Seen by NP last week. Had xray of right shoulder and cervical spine which showed mild degenerative changes. Started on Meloxicam with no improvement.  No trauma noted to shoulder.     Past medical, surgical, family and social history per today's encounter.  Review of Systems  Constitutional: Negative for fever, chills, activity change, appetite change, fatigue and unexpected weight change.  Eyes: Negative for visual disturbance.  Respiratory: Negative for cough and shortness of breath.   Cardiovascular: Negative for chest pain, palpitations and leg swelling.  Gastrointestinal: Negative for abdominal pain and abdominal distention.  Genitourinary: Negative for dysuria, urgency and difficulty urinating.  Musculoskeletal: Positive for myalgias, back pain and arthralgias. Negative for gait problem.  Skin: Negative for color change and rash.  Neurological: Negative for weakness and numbness.  Hematological: Negative for adenopathy.  Psychiatric/Behavioral: Negative for sleep disturbance and dysphoric mood. The patient is not nervous/anxious.        Objective:    BP 149/81 mmHg  Pulse 62  Temp(Src) 97.9 F (36.6 C) (Oral)  Ht 5\' 5"  (1.651 m)  Wt 188 lb (85.276 kg)  BMI 31.28 kg/m2  SpO2 97% Physical Exam  Constitutional: He is oriented to person, place, and time. He appears well-developed and well-nourished. No distress.  HENT:  Head: Normocephalic and atraumatic.  Right Ear: External ear normal.  Left Ear: External ear normal.  Nose: Nose normal.  Mouth/Throat: Oropharynx is clear and moist.  Eyes: Conjunctivae and EOM are normal. Pupils are equal, round, and reactive to light. Right  eye exhibits no discharge. Left eye exhibits no discharge. No scleral icterus.  Neck: Normal range of motion. Neck supple. No tracheal deviation present. No thyromegaly present.  Cardiovascular: Normal rate, regular rhythm and normal heart sounds.  Exam reveals no gallop and no friction rub.   No murmur heard. Pulmonary/Chest: Effort normal and breath sounds normal. No accessory muscle usage. No tachypnea. No respiratory distress. He has no decreased breath sounds. He has no wheezes. He has no rhonchi. He has no rales.   He exhibits no tenderness.  Musculoskeletal: Normal range of motion. He exhibits no edema.  Lymphadenopathy:    He has no cervical adenopathy.  Neurological: He is alert and oriented to person, place, and time. No cranial nerve deficit. Coordination normal.  Skin: Skin is warm and dry. No rash noted. He is not diaphoretic. No erythema. No pallor.  Psychiatric: He has a normal mood and affect. His behavior is normal. Judgment and thought content normal.          Assessment & Plan:   Problem List Items Addressed This Visit      Unprioritized   Chest wall pain - Primary    Right posterior chest wall pain with palpable muscular spasm right latissimus. Will start Flexeril. Discussed using TENS unit. Discussed massage therapy. Follow up recheck in 2-3 days. If no improvement then will set up sports med evaluation.      Relevant Medications   cyclobenzaprine (FLEXERIL) tablet   Other Relevant Orders   DG Chest 2 View       Return in about 2 days (around 04/28/2014) for Recheck.

## 2014-04-26 NOTE — Patient Instructions (Addendum)
Start Flexeril 5mg  up to three times daily for pain. Note this may make you drowsy.  Try using TENS unit at home.  Chest xray today at [facility].

## 2014-04-26 NOTE — Progress Notes (Signed)
Pre visit review using our clinic review tool, if applicable. No additional management support is needed unless otherwise documented below in the visit note. 

## 2014-04-28 ENCOUNTER — Telehealth: Payer: Self-pay | Admitting: Family Medicine

## 2014-04-28 ENCOUNTER — Encounter: Payer: Self-pay | Admitting: Internal Medicine

## 2014-04-28 ENCOUNTER — Ambulatory Visit (INDEPENDENT_AMBULATORY_CARE_PROVIDER_SITE_OTHER): Payer: Medicare Other | Admitting: Internal Medicine

## 2014-04-28 VITALS — BP 122/76 | HR 79 | Temp 98.1°F | Ht 65.0 in | Wt 189.1 lb

## 2014-04-28 DIAGNOSIS — R0789 Other chest pain: Secondary | ICD-10-CM

## 2014-04-28 MED ORDER — LIDOCAINE 5 % EX PTCH: 1.0000 | MEDICATED_PATCH | CUTANEOUS | Status: DC

## 2014-04-28 NOTE — Progress Notes (Signed)
   Subjective:    Patient ID: Lucas Black, male    DOB: [birth year], 74 y.o.   MRN: [id number]  HPI  74YO male presents for follow up.  Last seen - 2/22 with right posterior chest wall pain. Started on Flexeril.  Tried TENS unit and Flexeril with no improvement. Continue to have right posterior chest wall pain.  Past medical, surgical, family and social history per today's encounter.  Review of Systems  Constitutional: Negative for fever, chills, activity change, appetite change, fatigue and unexpected weight change.  Eyes: Negative for visual disturbance.  Respiratory: Negative for cough and shortness of breath.   Cardiovascular: Positive for chest pain. Negative for palpitations and leg swelling.  Gastrointestinal: Negative for abdominal pain and abdominal distention.  Genitourinary: Negative for dysuria, urgency and difficulty urinating.  Musculoskeletal: Positive for myalgias and back pain. Negative for arthralgias and gait problem.  Skin: Negative for color change and rash.  Hematological: Negative for adenopathy.  Psychiatric/Behavioral: Negative for sleep disturbance and dysphoric mood. The patient is not nervous/anxious.        Objective:    BP 122/76 mmHg  Pulse 79  Temp(Src) 98.1 F (36.7 C) (Oral)  Ht 5\' 5"  (1.651 m)  Wt 189 lb 2 oz (85.787 kg)  BMI 31.47 kg/m2  SpO2 96% Physical Exam  Constitutional: He is oriented to person, place, and time. He appears well-developed and well-nourished. No distress.  HENT:  Head: Normocephalic and atraumatic.  Right Ear: External ear normal.  Left Ear: External ear normal.  Nose: Nose normal.  Mouth/Throat: Oropharynx is clear and moist.  Eyes: Conjunctivae and EOM are normal. Pupils are equal, round, and reactive to light. Right eye exhibits no discharge. Left eye exhibits no discharge. No scleral icterus.  Neck: Normal range of motion. Neck supple. No tracheal deviation present. No thyromegaly present.  Cardiovascular:  Normal rate, regular rhythm and normal heart sounds.  Exam reveals no gallop and no friction rub.   No murmur heard. Pulmonary/Chest: Effort normal and breath sounds normal. No accessory muscle usage. No tachypnea. No respiratory distress. He has no decreased breath sounds. He has no wheezes. He has no rhonchi. He has no rales.   He exhibits tenderness.  Musculoskeletal: Normal range of motion. He exhibits no edema.  Lymphadenopathy:    He has no cervical adenopathy.  Neurological: He is alert and oriented to person, place, and time. No cranial nerve deficit. Coordination normal.  Skin: Skin is warm and dry. No rash noted. He is not diaphoretic. No erythema. No pallor.  Psychiatric: He has a normal mood and affect. His behavior is normal. Judgment and thought content normal.          Assessment & Plan:   Problem List Items Addressed This Visit      Unprioritized   Chest wall pain - Primary    Persistent right posterior chest wall pain which appears to be caused by spasm of the latissimus. No improvement with Flexeril or TENS. Will add topical Lidoderm. Will set up sports med evaluation. Question if local steroid injection might be helpful.      Relevant Medications   LIDODERM 5 % EX PTCH   Other Relevant Orders   Ambulatory referral to Sports Medicine       Return in about 4 weeks (around 05/26/2014) for Recheck.

## 2014-04-28 NOTE — Progress Notes (Signed)
Pre visit review using our clinic review tool, if applicable. No additional management support is needed unless otherwise documented below in the visit note. 

## 2014-04-28 NOTE — Assessment & Plan Note (Signed)
Persistent right posterior chest wall pain which appears to be caused by spasm of the latissimus. No improvement with Flexeril or TENS. Will add topical Lidoderm. Will set up sports med evaluation. Question if local steroid injection might be helpful.

## 2014-04-28 NOTE — Telephone Encounter (Signed)
Spoke to pt, scheduled him for 12.25.16 @ 12:45p.

## 2014-04-28 NOTE — Patient Instructions (Signed)
We will set up evaluation with Dr. Tamala Julian.  Follow up in 4 weeks.

## 2014-04-28 NOTE — Telephone Encounter (Signed)
Needs to refer patient to Dr. Tamala Julian.  Is requesting an appointment for this week.

## 2014-04-29 ENCOUNTER — Ambulatory Visit (INDEPENDENT_AMBULATORY_CARE_PROVIDER_SITE_OTHER): Payer: Medicare Other | Admitting: Family Medicine

## 2014-04-29 ENCOUNTER — Encounter: Payer: Self-pay | Admitting: Family Medicine

## 2014-04-29 VITALS — BP 136/78 | HR 92 | Ht 65.0 in | Wt 186.0 lb

## 2014-04-29 DIAGNOSIS — M62838 Other muscle spasm: Secondary | ICD-10-CM

## 2014-04-29 NOTE — Progress Notes (Signed)
Pre visit review using our clinic review tool, if applicable. No additional management support is needed unless otherwise documented below in the visit note. 

## 2014-04-29 NOTE — Patient Instructions (Addendum)
Good to meet you Ice 20 minutes 2 times daily. Usually after activity and before bed. Exercises 3 times a week.  Do monitor to see if any association with food.  Try the pennsaid twice daily as needed See me again in 2 weeks.    Standing:  Secure a rubber exercise band/tubing so that it is at the height of your shoulders when you are either standing or sitting on a firm arm-less chair.  Grasp an end of the band/tubing in each hand and have your palms face each other. Straighten your elbows and lift your hands straight in front of you at shoulder height. Step back away from the secured end of band/tubing until it becomes tense.  Squeeze your shoulder blades together. Keeping your elbows locked and your hands at shoulder-height, bring your hands out to your side.  Hold __________ seconds. Slowly ease the tension on the band/tubing as you reverse the directions and return to the starting position. Repeat __________ times. Complete this exercise __________ times per day. STRENGTH - Scapular Retractors  Secure a rubber exercise band/tubing so that it is at the height of your shoulders when you are either standing or sitting on a firm arm-less chair.  With a palm-down grip, grasp an end of the band/tubing in each hand. Straighten your elbows and lift your hands straight in front of you at shoulder height. Step back away from the secured end of band/tubing until it becomes tense.  Squeezing your shoulder blades together, draw your elbows back as you bend them. Keep your upper arm lifted away from your body throughout the exercise.  Hold __________ seconds. Slowly ease the tension on the band/tubing as you reverse the directions and return to the starting position. Repeat __________ times. Complete this exercise __________ times per day. STRENGTH - Shoulder Extensors   Secure a rubber exercise band/tubing so that it is at the height of your shoulders when you are either standing or sitting on a  firm arm-less chair.  With a thumbs-up grip, grasp an end of the band/tubing in each hand. Straighten your elbows and lift your hands straight in front of you at shoulder height. Step back away from the secured end of band/tubing until it becomes tense.  Squeezing your shoulder blades together, pull your hands down to the sides of your thighs. Do not allow your hands to go behind you.  Hold for __________ seconds. Slowly ease the tension on the band/tubing as you reverse the directions and return to the starting position. Repeat __________ times. Complete this exercise __________ times per day.  STRENGTH - Scapular Retractors and External Rotators  Secure a rubber exercise band/tubing so that it is at the height of your shoulders when you are either standing or sitting on a firm arm-less chair.  With a palm-down grip, grasp an end of the band/tubing in each hand. Bend your elbows 90 degrees and lift your elbows to shoulder height at your sides. Step back away from the secured end of band/tubing until it becomes tense.  Squeezing your shoulder blades together, rotate your shoulder so that your upper arm and elbow remain stationary, but your fists travel upward to head-height.  Hold __________ for seconds. Slowly ease the tension on the band/tubing as you reverse the directions and return to the starting position. Repeat __________ times. Complete this exercise __________ times per day.  STRENGTH - Scapular Retractors and External Rotators, Rowing  Secure a rubber exercise band/tubing so that it is at the height  of your shoulders when you are either standing or sitting on a firm arm-less chair.  With a palm-down grip, grasp an end of the band/tubing in each hand. Straighten your elbows and lift your hands straight in front of you at shoulder height. Step back away from the secured end of band/tubing until it becomes tense.  Step 1: Squeeze your shoulder blades together. Bending your elbows,  draw your hands to your chest as if you are rowing a boat. At the end of this motion, your hands and elbow should be at shoulder-height and your elbows should be out to your sides.  Step 2: Rotate your shoulder to raise your hands above your head. Your forearms should be vertical and your upper-arms should be horizontal.  Hold for __________ seconds. Slowly ease the tension on the band/tubing as you reverse the directions and return to the starting position. Repeat __________ times. Complete this exercise __________ times per day.  STRENGTH - Scapular Retractors and Elevators  Secure a rubber exercise band/tubing so that it is at the height of your shoulders when you are either standing or sitting on a firm arm-less chair.  With a thumbs-up grip, grasp an end of the band/tubing in each hand. Step back away from the secured end of band/tubing until it becomes tense.  Squeezing your shoulder blades together, straighten your elbows and lift your hands straight over your head.  Hold for __________ seconds. Slowly ease the tension on the band/tubing as you reverse the directions and return to the starting position. Repeat __________ times. Complete this exercise __________ times per day.  Document Released: 02/19/2005 Document Revised: 05/14/2011 Document Reviewed: 06/03/2008 [facility] Patient Information 2015 [place], [place]. This information is not intended to replace advice given to you by your health care provider. Make sure you discuss any questions you have with your health care provider.

## 2014-04-29 NOTE — Assessment & Plan Note (Signed)
Patient was given trigger point injections within the muscle spasm today. Patient tolerated the procedure well with almost complete resolution of pain. We discussed with patient home exercises and showed proper technique. We discussed topical anti-inflammatory and patient was given a trial size. Patient is to try to do these different conservative changes as well as an icing protocol and come back and see me again in 2 weeks to make sure that he is doing better. Differential also includes viscerosomatic pathology and we will discuss further at follow-up. Patient will keep a diary when this pain does occur in case that there is any association with food with him having a history of a gastric ulcer. Otherwise this is likely all musculoskeletal in nature.

## 2014-04-29 NOTE — Progress Notes (Signed)
  [facility] [address] [place], [place] [address] Phone: [phone] Subjective:    I'm seeing this patient by the request  of:  Rica Mast, MD   CC: Chest wall pain  DPO:[id number] Lucas Black is a 73 y.o. male coming in with complaint of right-sided posterior back pain that seems to radiate towards his chest from time to time. Patient states it always seems to be more of a chronic dull aching sensation on the posterior aspect. Patient states that it hurts more when touched as well. Sometimes can radiate through to his chest. Patient denies any association with exacerbation such as walking or running. Patient denies any radiation down the arm. States seems to stay localized. Denies any association with eating and has not had any change in bowel movement. Patient denies any fevers or chills or any abnormal weight loss. Patient was given a Lidoderm patch with minimal benefit at this time and has tried anti-inflammatories. Patient states that sometimes his pain is unrelenting and does keep him up at night. At the worst pain he can be a 7 out of 10 in severity.     Past medical history, social, surgical and family history all reviewed in electronic medical record.   Review of Systems: No headache, visual changes, nausea, vomiting, diarrhea, constipation, dizziness, abdominal pain, skin rash, fevers, chills, night sweats, weight loss, swollen lymph nodes, body aches, joint swelling, muscle aches, chest pain, shortness of breath, mood changes.   Objective Blood pressure 136/78, pulse 92, height 5\' 5"  (1.651 m), weight 186 lb (84.369 kg), SpO2 99 %.  General: No apparent distress alert and oriented x3 mood and affect normal, dressed appropriately.  HEENT: Pupils equal, extraocular movements intact  Respiratory: Patient's speak in full sentences and does not appear short of breath  Cardiovascular: No lower extremity edema, non tender, no erythema  Skin:  Warm dry intact with no signs of infection or rash on extremities or on axial skeleton.  Abdomen: Soft nontender  Neuro: Cranial nerves II through XII are intact, neurovascularly intact in all extremities with 2+ DTRs and 2+ pulses.  Lymph: No lymphadenopathy of posterior or anterior cervical chain or axillae bilaterally.  Gait normal with good balance and coordination.  MSK:  Non tender with full range of motion and good stability and symmetric strength and tone of shoulders, elbows, wrist, hip, knee and ankles bilaterally.  Chest wall does not show any significant abnormality. Patient has had significant abdominal surgery previously. Patient is tender to palpation though over the sixth rib on the right side posteriorly. Patient does have an overlying muscle spasm of the latissimus dorsi muscle. There are 3 specific trigger point within this muscle.   Procedure note After verbal consent patient was prepped with gauze swabs and with a 25 gauge half-inch needle patient was injected with a total of 1 mL of 0.5% Marcaine and 1 mL of Kenalog 40 g for deciliter. There is 3 different trigger points in total. Patient tolerated the procedure well with minimal blood loss. Post injection instructions given.   Impression and Recommendations:     This case required medical decision making of moderate complexity.

## 2014-05-10 ENCOUNTER — Encounter: Payer: Self-pay | Admitting: Internal Medicine

## 2014-05-12 ENCOUNTER — Encounter: Payer: Self-pay | Admitting: Family Medicine

## 2014-05-12 ENCOUNTER — Ambulatory Visit (INDEPENDENT_AMBULATORY_CARE_PROVIDER_SITE_OTHER): Payer: Medicare Other | Admitting: Family Medicine

## 2014-05-12 DIAGNOSIS — M62838 Other muscle spasm: Secondary | ICD-10-CM

## 2014-05-12 NOTE — Patient Instructions (Signed)
Good to see you Continue the cream 2 times a day Heat for 10 minutes when you wake up Stretch after working!!! See me again in 3-4 weeks if not perfect

## 2014-05-12 NOTE — Assessment & Plan Note (Signed)
Patient is doing significantly better at this time. I do not feel that there is any internal derangement of the shoulder that could be contributing or any cervical radicular symptoms. Patient seems to be localized. Patient does do a lot of repetitive motion during the day and we discussed about the cryotherapy as well as some heat that could be beneficial at different times during the day. We discussed topical anti-inflammatories and patient was givenanother trial size. Patient does lie is doing well we'll follow-up with me again in 4 weeks. If any worsening pain patient will come back sooner.

## 2014-05-12 NOTE — Progress Notes (Signed)
  [facility] [address] [place], [place] [address] Phone: [phone] Subjective:     CC: Chest wall pain follow-up  Lucas Black is a 74 y.o. male coming in with complaint of right-sided posterior back pain that seems to radiate towards his chest from time to time. Patient was seen and did have trigger points of the scapular region. Patient was given trigger point injections and stated that he is approximately 80% better. Patient has not been doing the home exercises but has been icing which has been helpful. Patient states is still some mild dull nagging pain but nothing like it was previously. Patient is happy with the results. at night. A     Past medical history, social, surgical and family history all reviewed in electronic medical record.   Review of Systems: No headache, visual changes, nausea, vomiting, diarrhea, constipation, dizziness, abdominal pain, skin rash, fevers, chills, night sweats, weight loss, swollen lymph nodes, body aches, joint swelling, muscle aches, chest pain, shortness of breath, mood changes.   Objective Vitals reviewed   General: No apparent distress alert and oriented x3 mood and affect normal, dressed appropriately.  HEENT: Pupils equal, extraocular movements intact  Respiratory: Patient's speak in full sentences and does not appear short of breath  Cardiovascular: No lower extremity edema, non tender, no erythema  Skin: Warm dry intact with no signs of infection or rash on extremities or on axial skeleton.  Abdomen: Soft nontender  Neuro: Cranial nerves II through XII are intact, neurovascularly intact in all extremities with 2+ DTRs and 2+ pulses.  Lymph: No lymphadenopathy of posterior or anterior cervical chain or axillae bilaterally.  Gait normal with good balance and coordination.  MSK:  Non tender with full range of motion and good stability and symmetric strength and tone of shoulders, elbows, wrist,  hip, knee and ankles bilaterally.  Chest wall does not show any significant abnormality. Patient has had significant abdominal surgery previously. Patient is no longer tender to palpation in the area over the sixth rib anteriorly. Patient has mild tightness of the rhomboids and trapezius on the right side still.      Impression and Recommendations:     This case required medical decision making of moderate complexity.

## 2014-05-12 NOTE — Progress Notes (Signed)
Pre visit review using our clinic review tool, if applicable. No additional management support is needed unless otherwise documented below in the visit note. 

## 2014-05-14 DIAGNOSIS — Z961 Presence of intraocular lens: Secondary | ICD-10-CM | POA: Diagnosis not present

## 2014-05-27 ENCOUNTER — Ambulatory Visit: Payer: Medicare Other | Admitting: Family Medicine

## 2014-06-24 ENCOUNTER — Ambulatory Visit (INDEPENDENT_AMBULATORY_CARE_PROVIDER_SITE_OTHER): Payer: Medicare Other | Admitting: Internal Medicine

## 2014-06-24 ENCOUNTER — Encounter: Payer: Self-pay | Admitting: Internal Medicine

## 2014-06-24 VITALS — BP 132/80 | HR 79 | Temp 98.2°F | Resp 14 | Ht 65.0 in | Wt 182.6 lb

## 2014-06-24 DIAGNOSIS — E785 Hyperlipidemia, unspecified: Secondary | ICD-10-CM | POA: Diagnosis not present

## 2014-06-24 DIAGNOSIS — I10 Essential (primary) hypertension: Secondary | ICD-10-CM

## 2014-06-24 DIAGNOSIS — H6122 Impacted cerumen, left ear: Secondary | ICD-10-CM | POA: Diagnosis not present

## 2014-06-24 DIAGNOSIS — J302 Other seasonal allergic rhinitis: Secondary | ICD-10-CM

## 2014-06-24 DIAGNOSIS — H6120 Impacted cerumen, unspecified ear: Secondary | ICD-10-CM | POA: Insufficient documentation

## 2014-06-24 HISTORY — DX: Other seasonal allergic rhinitis: J30.2

## 2014-06-24 LAB — COMPREHENSIVE METABOLIC PANEL
ALT: 14 U/L (ref 0–53)
AST: 20 U/L (ref 0–37)
Albumin: 4.3 g/dL (ref 3.5–5.2)
Alkaline Phosphatase: 80 U/L (ref 39–117)
BUN: 16 mg/dL (ref 6–23)
CHLORIDE: 105 meq/L (ref 96–112)
CO2: 26 meq/L (ref 19–32)
CREATININE: 1.27 mg/dL (ref 0.40–1.50)
Calcium: 10.1 mg/dL (ref 8.4–10.5)
GFR: 58.81 mL/min — AB (ref >60.00)
Glucose, Bld: 85 mg/dL (ref 70–99)
Potassium: 4.3 mEq/L (ref 3.5–5.1)
Sodium: 139 mEq/L (ref 135–145)
Total Bilirubin: 0.4 mg/dL (ref 0.2–1.2)
Total Protein: 7.2 g/dL (ref 6.0–8.3)

## 2014-06-24 MED ORDER — MONTELUKAST SODIUM 10 MG PO TABS: 10.0000 mg | ORAL_TABLET | Freq: Every day | ORAL | Status: DC

## 2014-06-24 NOTE — Progress Notes (Signed)
Subjective:    Patient ID: Lucas Black, male    DOB: [birth year], 74 y.o.   MRN: [id number]  HPI  74YO male presents for follow up.  Cough and congestion started about 1 week ago. No dyspnea. Cough productive of mucous at times. No fever. Feels tired and run down. Taking some allergy medication with no improvement.  Aside from this feeling well. Compliant with medications.  Past medical, surgical, family and social history per today's encounter.  Review of Systems  Constitutional: Positive for fatigue. Negative for fever, chills, activity change, appetite change and unexpected weight change.  HENT: Positive for congestion, postnasal drip, rhinorrhea, sinus pressure and sneezing. Negative for ear pain, sore throat, tinnitus, trouble swallowing and voice change.   Eyes: Negative for visual disturbance.  Respiratory: Positive for cough. Negative for chest tightness and shortness of breath.   Cardiovascular: Negative for chest pain, palpitations and leg swelling.  Gastrointestinal: Negative for abdominal pain, diarrhea, constipation and abdominal distention.  Genitourinary: Negative for dysuria, urgency and difficulty urinating.  Musculoskeletal: Negative for arthralgias and gait problem.  Skin: Negative for color change and rash.  Hematological: Negative for adenopathy.  Psychiatric/Behavioral: Negative for sleep disturbance and dysphoric mood. The patient is not nervous/anxious.        Objective:    BP 132/80 mmHg  Pulse 79  Temp(Src) 98.2 F (36.8 C) (Oral)  Resp 14  Ht 5\' 5"  (1.651 m)  Wt 182 lb 9.6 oz (82.827 kg)  BMI 30.39 kg/m2  SpO2 96% Physical Exam  Constitutional: He is oriented to person, place, and time. He appears well-developed and well-nourished. No distress.  HENT:  Head: Normocephalic and atraumatic.  Right Ear: External ear normal.  Left Ear: External ear normal.  Nose: Nose normal.  Mouth/Throat: Oropharynx is clear and moist. No oropharyngeal exudate.    Left ear canal initially impacted with cerumen, which was removed with warm water lavage.  Eyes: Conjunctivae and EOM are normal. Pupils are equal, round, and reactive to light. Right eye exhibits no discharge. Left eye exhibits no discharge. No scleral icterus.  Neck: Normal range of motion. Neck supple. No tracheal deviation present. No thyromegaly present.  Cardiovascular: Normal rate, regular rhythm and normal heart sounds.  Exam reveals no gallop and no friction rub.   No murmur heard. Pulmonary/Chest: Effort normal and breath sounds normal. No accessory muscle usage. No tachypnea. No respiratory distress. He has no decreased breath sounds. He has no wheezes. He has no rhonchi. He has no rales. He exhibits no tenderness.  Musculoskeletal: Normal range of motion. He exhibits no edema.  Lymphadenopathy:    He has no cervical adenopathy.  Neurological: He is alert and oriented to person, place, and time. No cranial nerve deficit. Coordination normal.  Skin: Skin is warm and dry. No rash noted. He is not diaphoretic. No erythema. No pallor.  Psychiatric: He has a normal mood and affect. His behavior is normal. Judgment and thought content normal.          Assessment & Plan:   Problem List Items Addressed This Visit      Unprioritized   Cerumen impaction    Cerumen removed with warm water lavage.      Hyperlipidemia    Lab Results  Component Value Date   LDLCALC 74 12/23/2013     Will check LFTs with labs. Continue Simvastatin.      Hypertension    BP Readings from Last 3 Encounters:  06/24/14 132/80  04/29/14 136/78  04/28/14 122/76   BP well controlled. Continue Lisinopril. Renal function with labs.      Relevant Orders   Comprehensive metabolic panel   Seasonal allergies - Primary    Recent worsening allergy symptoms. Will continue Claritin or Zyrtec and add Singulair. Follow up in 4 weeks and prn.      Relevant Medications   montelukast (SINGULAIR) 10 MG  tablet       Return in about 4 weeks (around 07/22/2014) for Recheck.

## 2014-06-24 NOTE — Assessment & Plan Note (Signed)
Lab Results  Component Value Date   LDLCALC 74 12/23/2013     Will check LFTs with labs. Continue Simvastatin.

## 2014-06-24 NOTE — Progress Notes (Signed)
Pre visit review using our clinic review tool, if applicable. No additional management support is needed unless otherwise documented below in the visit note. 

## 2014-06-24 NOTE — Assessment & Plan Note (Signed)
Recent worsening allergy symptoms. Will continue Claritin or Zyrtec and add Singulair. Follow up in 4 weeks and prn.

## 2014-06-24 NOTE — Assessment & Plan Note (Signed)
Cerumen removed with warm water lavage.

## 2014-06-24 NOTE — Assessment & Plan Note (Signed)
BP Readings from Last 3 Encounters:  06/24/14 132/80  04/29/14 136/78  04/28/14 122/76   BP well controlled. Continue Lisinopril. Renal function with labs.

## 2014-06-24 NOTE — Patient Instructions (Signed)
Start Singulair daily.  Continue Claritin or Zyrtec daily.  Labs today.  Call if symptoms are not improving.

## 2014-08-17 ENCOUNTER — Other Ambulatory Visit: Payer: Self-pay | Admitting: Internal Medicine

## 2014-08-30 ENCOUNTER — Other Ambulatory Visit: Payer: Self-pay | Admitting: *Deleted

## 2014-08-30 DIAGNOSIS — J302 Other seasonal allergic rhinitis: Secondary | ICD-10-CM

## 2014-08-30 MED ORDER — MONTELUKAST SODIUM 10 MG PO TABS: 10.0000 mg | ORAL_TABLET | Freq: Every day | ORAL | Status: DC

## 2014-08-30 MED ORDER — MELOXICAM 15 MG PO TABS: 15.0000 mg | ORAL_TABLET | Freq: Every day | ORAL | Status: DC

## 2014-08-30 NOTE — Telephone Encounter (Signed)
Refill request from [facility], not on current med list. Ok refill?

## 2014-10-27 DIAGNOSIS — C44319 Basal cell carcinoma of skin of other parts of face: Secondary | ICD-10-CM | POA: Diagnosis not present

## 2014-11-17 ENCOUNTER — Other Ambulatory Visit: Payer: Self-pay | Admitting: Internal Medicine

## 2014-12-15 ENCOUNTER — Ambulatory Visit (INDEPENDENT_AMBULATORY_CARE_PROVIDER_SITE_OTHER): Payer: Medicare Other | Admitting: *Deleted

## 2014-12-15 DIAGNOSIS — Z23 Encounter for immunization: Secondary | ICD-10-CM | POA: Diagnosis not present

## 2014-12-22 ENCOUNTER — Encounter: Payer: Self-pay | Admitting: Internal Medicine

## 2014-12-22 ENCOUNTER — Ambulatory Visit (INDEPENDENT_AMBULATORY_CARE_PROVIDER_SITE_OTHER): Payer: Medicare Other | Admitting: Internal Medicine

## 2014-12-22 VITALS — BP 128/77 | HR 69 | Temp 98.0°F | Ht 65.0 in | Wt 182.4 lb

## 2014-12-22 DIAGNOSIS — I10 Essential (primary) hypertension: Secondary | ICD-10-CM | POA: Diagnosis not present

## 2014-12-22 DIAGNOSIS — N400 Enlarged prostate without lower urinary tract symptoms: Secondary | ICD-10-CM | POA: Diagnosis not present

## 2014-12-22 DIAGNOSIS — J441 Chronic obstructive pulmonary disease with (acute) exacerbation: Secondary | ICD-10-CM

## 2014-12-22 DIAGNOSIS — E785 Hyperlipidemia, unspecified: Secondary | ICD-10-CM | POA: Diagnosis not present

## 2014-12-22 LAB — CBC WITH DIFFERENTIAL/PLATELET
BASOS ABS: 0 10*3/uL (ref 0.0–0.1)
Basophils Relative: 0.3 % (ref 0.0–3.0)
EOS ABS: 0.1 10*3/uL (ref 0.0–0.7)
Eosinophils Relative: 1.6 % (ref 0.0–5.0)
HEMATOCRIT: 39.3 % (ref 39.0–52.0)
HEMOGLOBIN: 13.2 g/dL (ref 13.0–17.0)
LYMPHS PCT: 18.2 % (ref 12.0–46.0)
Lymphs Abs: 1.6 10*3/uL (ref 0.7–4.0)
MCHC: 33.6 g/dL (ref 30.0–36.0)
MCV: 88.5 fl (ref 78.0–100.0)
MONOS PCT: 8.5 % (ref 3.0–12.0)
Monocytes Absolute: 0.7 10*3/uL (ref 0.1–1.0)
Neutro Abs: 6.2 10*3/uL (ref 1.4–7.7)
Neutrophils Relative %: 71.4 % (ref 43.0–77.0)
Platelets: 178 10*3/uL (ref 150.0–400.0)
RBC: 4.44 Mil/uL (ref 4.22–5.81)
RDW: 13.9 % (ref 11.5–15.5)
WBC: 8.7 10*3/uL (ref 4.0–10.5)

## 2014-12-22 LAB — COMPREHENSIVE METABOLIC PANEL
ALBUMIN: 4.3 g/dL (ref 3.5–5.2)
ALK PHOS: 73 U/L (ref 39–117)
ALT: 13 U/L (ref 0–53)
AST: 16 U/L (ref 0–37)
BILIRUBIN TOTAL: 0.5 mg/dL (ref 0.2–1.2)
BUN: 26 mg/dL — ABNORMAL HIGH (ref 6–23)
CALCIUM: 9.8 mg/dL (ref 8.4–10.5)
CO2: 27 mEq/L (ref 19–32)
CREATININE: 1.47 mg/dL (ref 0.40–1.50)
Chloride: 105 mEq/L (ref 96–112)
GFR: 49.61 mL/min — ABNORMAL LOW (ref >60.00)
Glucose, Bld: 86 mg/dL (ref 70–99)
Potassium: 4.1 mEq/L (ref 3.5–5.1)
Sodium: 140 mEq/L (ref 135–145)
TOTAL PROTEIN: 6.7 g/dL (ref 6.0–8.3)

## 2014-12-22 LAB — PSA, MEDICARE: PSA: 0.26 ng/ml (ref 0.10–4.00)

## 2014-12-22 LAB — LIPID PANEL
CHOL/HDL RATIO: 3
CHOLESTEROL: 150 mg/dL (ref 0–200)
HDL: 49.5 mg/dL (ref >39.00)
LDL Cholesterol: 80 mg/dL (ref 0–99)
NonHDL: 100.24
TRIGLYCERIDES: 102 mg/dL (ref 0.0–149.0)
VLDL: 20.4 mg/dL (ref 0.0–40.0)

## 2014-12-22 LAB — MICROALBUMIN / CREATININE URINE RATIO
CREATININE, U: 207.6 mg/dL
MICROALB/CREAT RATIO: 0.3 mg/g (ref 0.0–30.0)
Microalb, Ur: 0.7 mg/dL (ref 0.0–1.9)

## 2014-12-22 MED ORDER — PREDNISONE 10 MG PO TABS: ORAL_TABLET | ORAL | Status: DC

## 2014-12-22 MED ORDER — LEVOFLOXACIN 500 MG PO TABS: 500.0000 mg | ORAL_TABLET | Freq: Every day | ORAL | Status: DC

## 2014-12-22 NOTE — Progress Notes (Signed)
Pre visit review using our clinic review tool, if applicable. No additional management support is needed unless otherwise documented below in the visit note. 

## 2014-12-22 NOTE — Assessment & Plan Note (Signed)
Lipids with labs today. Continue Simvastatin.

## 2014-12-22 NOTE — Assessment & Plan Note (Signed)
Symptoms consistent with early COPD exacerbation. Will start Prednisone taper and Levaquin. Follow up if symptoms not improving over next few days.

## 2014-12-22 NOTE — Assessment & Plan Note (Signed)
BP Readings from Last 3 Encounters:  12/22/14 128/77  06/24/14 132/80  04/29/14 136/78   BP well controlled. Renal function with labs. Continue current medications.

## 2014-12-22 NOTE — Progress Notes (Signed)
Subjective:    Patient ID: Lucas Black, male    DOB: Dec 06, 1940, 74 y.o.   MRN: [id number]  HPI  74YO male presents for follow up.  COPD - Notes some cough and congestion over last week or so. Cough productive of purulent mucous. Notes tightness in the chest. No fever, chills. Consistent with previous COPD exacerbations. Taking Nyquil with no improvement.   Aside from this, feeling well.  Compliant with medications.  Wt Readings from Last 3 Encounters:  12/22/14 182 lb 6 oz (82.725 kg)  06/24/14 182 lb 9.6 oz (82.827 kg)  04/29/14 186 lb (84.369 kg)   BP Readings from Last 3 Encounters:  12/22/14 128/77  06/24/14 132/80  04/29/14 136/78    Past Medical History  Diagnosis Date  . Hyperlipidemia   . Hypertension   . Screening for colon cancer 2011    colonoscopy normal   Family History  Problem Relation Age of Onset  . Cancer Mother     colon cancer  . Heart disease Father   . Heart disease Brother   . Cancer Brother     skin   Past Surgical History  Procedure Laterality Date  . Gastric resection  1973    secondary to ulcer  . Prostate surgery    . Cataract extraction Left 2015   Social History   Social History  . Marital Status: Married    Spouse Name: N/A  . Number of Children: N/A  . Years of Education: N/A   Social History Main Topics  . Smoking status: Former Smoker    Quit date: 03/31/1984  . Smokeless tobacco: Never Used  . Alcohol Use: Yes     Comment: rarely  . Drug Use: No  . Sexual Activity: Not Asked   Other Topics Concern  . None   Social History Narrative    Review of Systems  Constitutional: Negative for fever, chills, activity change, appetite change, fatigue and unexpected weight change.  HENT: Positive for congestion and postnasal drip. Negative for ear discharge, ear pain, hearing loss, nosebleeds, rhinorrhea, sinus pressure, sneezing, sore throat, tinnitus, trouble swallowing and voice change.   Eyes: Negative for discharge,  redness, itching and visual disturbance.  Respiratory: Positive for cough, chest tightness and shortness of breath. Negative for wheezing and stridor.   Cardiovascular: Negative for chest pain, palpitations and leg swelling.  Gastrointestinal: Negative for nausea, vomiting, abdominal pain, diarrhea, constipation and abdominal distention.  Genitourinary: Negative for dysuria, urgency and difficulty urinating.  Musculoskeletal: Negative for myalgias, arthralgias, gait problem, neck pain and neck stiffness.  Skin: Negative for color change and rash.  Neurological: Negative for dizziness, facial asymmetry and headaches.  Hematological: Negative for adenopathy.  Psychiatric/Behavioral: Negative for sleep disturbance and dysphoric mood. The patient is not nervous/anxious.        Objective:    BP 128/77 mmHg  Pulse 69  Temp(Src) 98 F (36.7 C) (Oral)  Ht 5\' 5"  (1.651 m)  Wt 182 lb 6 oz (82.725 kg)  BMI 30.35 kg/m2  SpO2 96% Physical Exam  Constitutional: He is oriented to person, place, and time. He appears well-developed and well-nourished. No distress.  HENT:  Head: Normocephalic and atraumatic.  Right Ear: External ear normal.  Left Ear: External ear normal.  Nose: Nose normal.  Mouth/Throat: Oropharynx is clear and moist. No oropharyngeal exudate.  Eyes: Conjunctivae and EOM are normal. Pupils are equal, round, and reactive to light. Right eye exhibits no discharge. Left eye exhibits no discharge. No  scleral icterus.  Neck: Normal range of motion. Neck supple. No tracheal deviation present. No thyromegaly present.  Cardiovascular: Normal rate, regular rhythm and normal heart sounds.  Exam reveals no gallop and no friction rub.   No murmur heard. Pulmonary/Chest: Effort normal. No accessory muscle usage. No tachypnea. No respiratory distress. He has decreased breath sounds (prolonged expiratory phase). He has no wheezes. He has rhonchi (few scattered). He has no rales. He exhibits no  tenderness.  Musculoskeletal: Normal range of motion. He exhibits no edema.  Lymphadenopathy:    He has no cervical adenopathy.  Neurological: He is alert and oriented to person, place, and time. No cranial nerve deficit. Coordination normal.  Skin: Skin is warm and dry. No rash noted. He is not diaphoretic. No erythema. No pallor.  Psychiatric: He has a normal mood and affect. His behavior is normal. Judgment and thought content normal.          Assessment & Plan:   Problem List Items Addressed This Visit      Unprioritized   COPD (chronic obstructive pulmonary disease) ([place]) - Primary (Chronic)    Symptoms consistent with early COPD exacerbation. Will start Prednisone taper and Levaquin. Follow up if symptoms not improving over next few days.      Relevant Medications   levofloxacin (LEVAQUIN) 500 MG tablet   predniSONE (DELTASONE) 10 MG tablet   Other Relevant Orders   CBC with Differential/Platelet   Hyperlipidemia (Chronic)    Lipids with labs today. Continue Simvastatin.      Relevant Orders   Lipid panel   Hypertension (Chronic)    BP Readings from Last 3 Encounters:  12/22/14 128/77  06/24/14 132/80  04/29/14 136/78   BP well controlled. Renal function with labs. Continue current medications.      Relevant Orders   Comprehensive metabolic panel   Microalbumin / creatinine urine ratio   Prostatic enlargement   Relevant Orders   PSA, Medicare       Return in about 6 months (around 06/22/2015) for Wellness Visit.

## 2014-12-22 NOTE — Patient Instructions (Signed)
Labs today.  Start Prednisone taper and Levaquin. Call if symptoms are not improving.

## 2014-12-23 ENCOUNTER — Ambulatory Visit: Payer: Medicare Other | Admitting: Internal Medicine

## 2015-02-05 ENCOUNTER — Other Ambulatory Visit: Payer: Self-pay | Admitting: Internal Medicine

## 2015-04-19 ENCOUNTER — Other Ambulatory Visit: Payer: Self-pay | Admitting: Internal Medicine

## 2015-05-03 ENCOUNTER — Other Ambulatory Visit: Payer: Self-pay | Admitting: Internal Medicine

## 2015-05-18 DIAGNOSIS — H26493 Other secondary cataract, bilateral: Secondary | ICD-10-CM | POA: Diagnosis not present

## 2015-05-18 DIAGNOSIS — H538 Other visual disturbances: Secondary | ICD-10-CM | POA: Diagnosis not present

## 2015-05-26 ENCOUNTER — Encounter: Payer: Self-pay | Admitting: Internal Medicine

## 2015-05-26 ENCOUNTER — Telehealth: Payer: Self-pay | Admitting: *Deleted

## 2015-05-26 ENCOUNTER — Ambulatory Visit (INDEPENDENT_AMBULATORY_CARE_PROVIDER_SITE_OTHER): Payer: Medicare Other | Admitting: Internal Medicine

## 2015-05-26 VITALS — BP 138/72 | HR 71 | Temp 97.8°F | Ht 65.0 in | Wt 180.1 lb

## 2015-05-26 DIAGNOSIS — J440 Chronic obstructive pulmonary disease with acute lower respiratory infection: Secondary | ICD-10-CM

## 2015-05-26 MED ORDER — LEVOFLOXACIN 500 MG PO TABS: 500.0000 mg | ORAL_TABLET | Freq: Every day | ORAL | Status: DC

## 2015-05-26 MED ORDER — PREDNISONE 10 MG PO TABS: ORAL_TABLET | ORAL | Status: DC

## 2015-05-26 MED ORDER — ALBUTEROL SULFATE HFA 108 (90 BASE) MCG/ACT IN AERS: 2.0000 | INHALATION_SPRAY | Freq: Four times a day (QID) | RESPIRATORY_TRACT | Status: DC | PRN

## 2015-05-26 NOTE — Patient Instructions (Signed)

## 2015-05-26 NOTE — Progress Notes (Signed)
Subjective:    Patient ID: Lucas Black, male    DOB: 29-Jan-1941, 75 y.o.   MRN: [id number]  HPI  75YO male presents for acute visit.  Cough and congestion - Started a few weeks ago.  Coughing up thick mucous. No dyspnea. Some wheezing. Using albuterol inhaler with some improvement. Using codeine cough syrup with some improvement. No fever, chills.  Wt Readings from Last 3 Encounters:  05/26/15 180 lb 2 oz (81.704 kg)  12/22/14 182 lb 6 oz (82.725 kg)  06/24/14 182 lb 9.6 oz (82.827 kg)   BP Readings from Last 3 Encounters:  05/26/15 138/72  12/22/14 128/77  06/24/14 132/80    Past Medical History  Diagnosis Date  . Hyperlipidemia   . Hypertension   . Screening for colon cancer 2011    colonoscopy normal   Family History  Problem Relation Age of Onset  . Cancer Mother     colon cancer  . Heart disease Father   . Heart disease Brother   . Cancer Brother     skin   Past Surgical History  Procedure Laterality Date  . Gastric resection  1973    secondary to ulcer  . Prostate surgery    . Cataract extraction Left 2015   Social History   Social History  . Marital Status: Married    Spouse Name: N/A  . Number of Children: N/A  . Years of Education: N/A   Social History Main Topics  . Smoking status: Former Smoker    Quit date: 03/31/1984  . Smokeless tobacco: Never Used  . Alcohol Use: Yes     Comment: rarely  . Drug Use: No  . Sexual Activity: Not Asked   Other Topics Concern  . None   Social History Narrative    Review of Systems  Constitutional: Negative for fever, chills, activity change, appetite change, fatigue and unexpected weight change.  HENT: Positive for congestion, postnasal drip and rhinorrhea. Negative for ear discharge, ear pain, hearing loss, nosebleeds, sinus pressure, sneezing, sore throat, tinnitus, trouble swallowing and voice change.   Eyes: Negative for discharge, redness, itching and visual disturbance.  Respiratory: Positive  for cough, chest tightness and wheezing. Negative for shortness of breath and stridor.   Cardiovascular: Negative for chest pain, palpitations and leg swelling.  Gastrointestinal: Negative for nausea, vomiting, abdominal pain, diarrhea, constipation and abdominal distention.  Genitourinary: Negative for dysuria, urgency and difficulty urinating.  Musculoskeletal: Negative for myalgias, arthralgias, gait problem, neck pain and neck stiffness.  Skin: Negative for color change and rash.  Neurological: Negative for dizziness, facial asymmetry and headaches.  Hematological: Negative for adenopathy.  Psychiatric/Behavioral: Negative for sleep disturbance and dysphoric mood. The patient is not nervous/anxious.        Objective:    BP 138/72 mmHg  Pulse 71  Temp(Src) 97.8 F (36.6 C) (Oral)  Ht 5\' 5"  (1.651 m)  Wt 180 lb 2 oz (81.704 kg)  BMI 29.97 kg/m2  SpO2 96% Physical Exam  Constitutional: He is oriented to person, place, and time. He appears well-developed and well-nourished. No distress.  HENT:  Head: Normocephalic and atraumatic.  Right Ear: External ear normal.  Left Ear: External ear normal.  Nose: Nose normal.  Mouth/Throat: Oropharynx is clear and moist. No oropharyngeal exudate.  Eyes: Conjunctivae and EOM are normal. Pupils are equal, round, and reactive to light. Right eye exhibits no discharge. Left eye exhibits no discharge. No scleral icterus.  Neck: Normal range of motion. Neck supple.  No tracheal deviation present. No thyromegaly present.  Cardiovascular: Normal rate, regular rhythm and normal heart sounds.  Exam reveals no gallop and no friction rub.   No murmur heard. Pulmonary/Chest: Effort normal. No accessory muscle usage. No tachypnea. No respiratory distress. He has decreased breath sounds. He has wheezes (scattered expiratory wheezes). He has rhonchi. He has no rales. He exhibits no tenderness.  Musculoskeletal: Normal range of motion. He exhibits no edema.    Lymphadenopathy:    He has no cervical adenopathy.  Neurological: He is alert and oriented to person, place, and time. No cranial nerve deficit. Coordination normal.  Skin: Skin is warm and dry. No rash noted. He is not diaphoretic. No erythema. No pallor.  Psychiatric: He has a normal mood and affect. His behavior is normal. Judgment and thought content normal.          Assessment & Plan:   Problem List Items Addressed This Visit      Unprioritized   Bronchitis, chronic obstructive w acute bronchitis ([place]) - Primary    Symptoms and exam c/w bronchitis. Will start Prednisone taper and Levaquin. Use Albuterol prn. Follow up if symptoms are not improving.      Relevant Medications   predniSONE (DELTASONE) 10 MG tablet   albuterol (PROVENTIL HFA;VENTOLIN HFA) 108 (90 Base) MCG/ACT inhaler       Return if symptoms worsen or fail to improve.  Ronette Deter, MD Internal Medicine [facility]

## 2015-05-26 NOTE — Assessment & Plan Note (Signed)
Symptoms and exam c/w bronchitis. Will start Prednisone taper and Levaquin. Use Albuterol prn. Follow up if symptoms are not improving.

## 2015-05-26 NOTE — Telephone Encounter (Signed)
Pt's wife sent following message through her MyChart:  Dr. Gilford Rile, Lucas Black needs to see you as soon as possible tomorrow Thurs. March 23rd, please. He is spitting up junk and sounds like he can hardly breathe. You know he is really sick when he asks me to contact you. He will come anytime you say. Please call him in the a.m. Phone [phone]. He will be waiting. Thanks so much.   Also, the dermatologist gave me more antibiotics to take for 30 dsys, so therefore I have a bad yeast infection. Would you call in ([facility]) a prescription or should I call her. Thanks!    Advised pt that would could see him today at 11:30

## 2015-05-26 NOTE — Progress Notes (Signed)
Pre visit review using our clinic review tool, if applicable. No additional management support is needed unless otherwise documented below in the visit note. 

## 2015-06-05 ENCOUNTER — Encounter: Payer: Self-pay | Admitting: Internal Medicine

## 2015-06-06 ENCOUNTER — Ambulatory Visit: Payer: Medicare Other | Admitting: Internal Medicine

## 2015-06-07 ENCOUNTER — Encounter: Payer: Self-pay | Admitting: Internal Medicine

## 2015-06-07 ENCOUNTER — Ambulatory Visit (INDEPENDENT_AMBULATORY_CARE_PROVIDER_SITE_OTHER)
Admission: RE | Admit: 2015-06-07 | Discharge: 2015-06-07 | Disposition: A | Discharge status: Home / Self Care | Payer: Medicare Other | Source: Ambulatory Visit | Attending: Internal Medicine | Admitting: Internal Medicine

## 2015-06-07 ENCOUNTER — Ambulatory Visit (INDEPENDENT_AMBULATORY_CARE_PROVIDER_SITE_OTHER): Payer: Medicare Other | Admitting: Internal Medicine

## 2015-06-07 VITALS — BP 110/74 | HR 88 | Temp 98.3°F | Ht 65.0 in | Wt 175.5 lb

## 2015-06-07 DIAGNOSIS — J209 Acute bronchitis, unspecified: Secondary | ICD-10-CM

## 2015-06-07 DIAGNOSIS — R05 Cough: Secondary | ICD-10-CM | POA: Diagnosis not present

## 2015-06-07 MED ORDER — PREDNISONE 10 MG PO TABS: ORAL_TABLET | ORAL | Status: DC

## 2015-06-07 MED ORDER — DOXYCYCLINE HYCLATE 100 MG PO TABS: 100.0000 mg | ORAL_TABLET | Freq: Two times a day (BID) | ORAL | Status: DC

## 2015-06-07 NOTE — Progress Notes (Signed)
Pre visit review using our clinic review tool, if applicable. No additional management support is needed unless otherwise documented below in the visit note. 

## 2015-06-07 NOTE — Assessment & Plan Note (Signed)
Symptoms and exam c/w recurrent bronchitis. Will get CXR. Start Doxycycline and Prednisone taper. Continue prn Albuterol. Recheck in 1 week and sooner if symptoms are not improving.

## 2015-06-07 NOTE — Progress Notes (Signed)
Subjective:    Patient ID: RASHIED SWITZER, male    DOB: Nov 30, 1940, 75 y.o.   MRN: [id number]  HPI  75YO male presents for follow up.  Recently treated for bronchitis. Initially treated with Prednisone and Levaquin. Had some improvement, however then wheezing and cough returned. Feels short of breath. No fever, chills. Cough productive of purulent mucous. Taking some OTC cough syrup at home with some improvement.   Wt Readings from Last 3 Encounters:  06/07/15 175 lb 8 oz (79.606 kg)  05/26/15 180 lb 2 oz (81.704 kg)  12/22/14 182 lb 6 oz (82.725 kg)   BP Readings from Last 3 Encounters:  06/07/15 110/74  05/26/15 138/72  12/22/14 128/77    Past Medical History  Diagnosis Date  . Hyperlipidemia   . Hypertension   . Screening for colon cancer 2011    colonoscopy normal   Family History  Problem Relation Age of Onset  . Cancer Mother     colon cancer  . Heart disease Father   . Heart disease Brother   . Cancer Brother     skin   Past Surgical History  Procedure Laterality Date  . Gastric resection  1973    secondary to ulcer  . Prostate surgery    . Cataract extraction Left 2015   Social History   Social History  . Marital Status: Married    Spouse Name: N/A  . Number of Children: N/A  . Years of Education: N/A   Social History Main Topics  . Smoking status: Former Smoker    Quit date: 03/31/1984  . Smokeless tobacco: Never Used  . Alcohol Use: Yes     Comment: rarely  . Drug Use: No  . Sexual Activity: Not Asked   Other Topics Concern  . None   Social History Narrative    Review of Systems  Constitutional: Positive for fatigue. Negative for fever, chills and activity change.  HENT: Negative for congestion, ear discharge, ear pain, hearing loss, nosebleeds, postnasal drip, rhinorrhea, sinus pressure, sneezing, sore throat, tinnitus, trouble swallowing and voice change.   Eyes: Negative for discharge, redness, itching and visual disturbance.    Respiratory: Positive for cough, chest tightness, shortness of breath and wheezing. Negative for stridor.   Cardiovascular: Negative for chest pain and leg swelling.  Musculoskeletal: Negative for myalgias, arthralgias, neck pain and neck stiffness.  Skin: Negative for color change and rash.  Neurological: Negative for dizziness, facial asymmetry and headaches.  Psychiatric/Behavioral: Negative for sleep disturbance.       Objective:    BP 110/74 mmHg  Pulse 88  Temp(Src) 98.3 F (36.8 C) (Oral)  Ht 5\' 5"  (1.651 m)  Wt 175 lb 8 oz (79.606 kg)  BMI 29.20 kg/m2  SpO2 95% Physical Exam  Constitutional: He is oriented to person, place, and time. He appears well-developed and well-nourished. No distress.  HENT:  Head: Normocephalic and atraumatic.  Right Ear: External ear normal.  Left Ear: External ear normal.  Nose: Nose normal.  Mouth/Throat: Oropharynx is clear and moist. No oropharyngeal exudate.  Eyes: Conjunctivae and EOM are normal. Pupils are equal, round, and reactive to light. Right eye exhibits no discharge. Left eye exhibits no discharge. No scleral icterus.  Neck: Normal range of motion. Neck supple. No tracheal deviation present. No thyromegaly present.  Cardiovascular: Normal rate, regular rhythm and normal heart sounds.  Exam reveals no gallop and no friction rub.   No murmur heard. Pulmonary/Chest: Effort normal. No accessory muscle  usage. No tachypnea. No respiratory distress. He has no decreased breath sounds. He has no wheezes. He has rhonchi (scattered throughout). He has no rales. He exhibits no tenderness.  Musculoskeletal: Normal range of motion. He exhibits no edema.  Lymphadenopathy:    He has no cervical adenopathy.  Neurological: He is alert and oriented to person, place, and time. No cranial nerve deficit. Coordination normal.  Skin: Skin is warm and dry. No rash noted. He is not diaphoretic. No erythema. No pallor.  Psychiatric: He has a normal mood  and affect. His behavior is normal. Judgment and thought content normal.          Assessment & Plan:   Problem List Items Addressed This Visit      Unprioritized   Acute bronchitis - Primary    Symptoms and exam c/w recurrent bronchitis. Will get CXR. Start Doxycycline and Prednisone taper. Continue prn Albuterol. Recheck in 1 week and sooner if symptoms are not improving.      Relevant Medications   doxycycline (VIBRA-TABS) 100 MG tablet   predniSONE (DELTASONE) 10 MG tablet   Other Relevant Orders   DG Chest 2 View       Return in about 1 week (around 06/14/2015).  Ronette Deter, MD Internal Medicine [facility]

## 2015-06-07 NOTE — Patient Instructions (Signed)
Chest xray today at [facility].  Start Prednisone taper and Doxycycline.  Recheck next week or sooner as needed.

## 2015-06-14 ENCOUNTER — Encounter: Payer: Self-pay | Admitting: Internal Medicine

## 2015-06-14 ENCOUNTER — Ambulatory Visit (INDEPENDENT_AMBULATORY_CARE_PROVIDER_SITE_OTHER): Payer: Medicare Other | Admitting: Internal Medicine

## 2015-06-14 VITALS — BP 122/77 | HR 68 | Temp 98.0°F | Ht 65.0 in | Wt 178.0 lb

## 2015-06-14 DIAGNOSIS — J441 Chronic obstructive pulmonary disease with (acute) exacerbation: Secondary | ICD-10-CM | POA: Diagnosis not present

## 2015-06-14 DIAGNOSIS — E785 Hyperlipidemia, unspecified: Secondary | ICD-10-CM

## 2015-06-14 DIAGNOSIS — I10 Essential (primary) hypertension: Secondary | ICD-10-CM

## 2015-06-14 LAB — COMPREHENSIVE METABOLIC PANEL
ALBUMIN: 3.9 g/dL (ref 3.5–5.2)
ALK PHOS: 71 U/L (ref 39–117)
ALT: 12 U/L (ref 0–53)
AST: 13 U/L (ref 0–37)
BILIRUBIN TOTAL: 0.8 mg/dL (ref 0.2–1.2)
BUN: 30 mg/dL — ABNORMAL HIGH (ref 6–23)
CO2: 29 mEq/L (ref 19–32)
Calcium: 10.3 mg/dL (ref 8.4–10.5)
Chloride: 103 mEq/L (ref 96–112)
Creatinine, Ser: 1.29 mg/dL (ref 0.40–1.50)
GFR: 57.6 mL/min — ABNORMAL LOW (ref >60.00)
GLUCOSE: 91 mg/dL (ref 70–99)
POTASSIUM: 4.3 meq/L (ref 3.5–5.1)
Sodium: 138 mEq/L (ref 135–145)
TOTAL PROTEIN: 6.4 g/dL (ref 6.0–8.3)

## 2015-06-14 LAB — MICROALBUMIN / CREATININE URINE RATIO
Creatinine,U: 53.9 mg/dL
MICROALB/CREAT RATIO: 1.3 mg/g (ref 0.0–30.0)

## 2015-06-14 NOTE — Progress Notes (Signed)
Subjective:    Patient ID: Lucas Black, male    DOB: 1941-01-29, 75 y.o.   MRN: [id number]  HPI  75YO male presents for follow up.  Recently seen for bronchitis. Completed Prednisone taper. Continues on Doxycycline. Cough improving. No dyspnea, wheezing. No fever.  HTN - Compliant with medication. No CP, headache.  Wt Readings from Last 3 Encounters:  06/14/15 178 lb (80.74 kg)  06/07/15 175 lb 8 oz (79.606 kg)  05/26/15 180 lb 2 oz (81.704 kg)   BP Readings from Last 3 Encounters:  06/14/15 122/77  06/07/15 110/74  05/26/15 138/72    Past Medical History  Diagnosis Date  . Hyperlipidemia   . Hypertension   . Screening for colon cancer 2011    colonoscopy normal   Family History  Problem Relation Age of Onset  . Cancer Mother     colon cancer  . Heart disease Father   . Heart disease Brother   . Cancer Brother     skin   Past Surgical History  Procedure Laterality Date  . Gastric resection  1973    secondary to ulcer  . Prostate surgery    . Cataract extraction Left 2015   Social History   Social History  . Marital Status: Married    Spouse Name: N/A  . Number of Children: N/A  . Years of Education: N/A   Social History Main Topics  . Smoking status: Former Smoker    Quit date: 03/31/1984  . Smokeless tobacco: Never Used  . Alcohol Use: Yes     Comment: rarely  . Drug Use: No  . Sexual Activity: Not Asked   Other Topics Concern  . None   Social History Narrative    Review of Systems  Constitutional: Negative for fever, chills, activity change, appetite change, fatigue and unexpected weight change.  HENT: Negative for congestion, postnasal drip, rhinorrhea, sinus pressure, sneezing, sore throat, tinnitus and voice change.   Eyes: Negative for visual disturbance.  Respiratory: Positive for cough. Negative for shortness of breath and wheezing.   Cardiovascular: Negative for chest pain, palpitations and leg swelling.  Gastrointestinal:  Negative for abdominal pain and abdominal distention.  Genitourinary: Negative for dysuria, urgency and difficulty urinating.  Musculoskeletal: Negative for arthralgias and gait problem.  Skin: Negative for color change and rash.  Hematological: Negative for adenopathy.  Psychiatric/Behavioral: Negative for sleep disturbance and dysphoric mood. The patient is not nervous/anxious.        Objective:    BP 122/77 mmHg  Pulse 68  Temp(Src) 98 F (36.7 C) (Oral)  Ht 5\' 5"  (1.651 m)  Wt 178 lb (80.74 kg)  BMI 29.62 kg/m2  SpO2 97% Physical Exam  Constitutional: He is oriented to person, place, and time. He appears well-developed and well-nourished. No distress.  HENT:  Head: Normocephalic and atraumatic.  Right Ear: External ear normal.  Left Ear: External ear normal.  Nose: Nose normal.  Mouth/Throat: Oropharynx is clear and moist. No oropharyngeal exudate.  Eyes: Conjunctivae and EOM are normal. Pupils are equal, round, and reactive to light. Right eye exhibits no discharge. Left eye exhibits no discharge. No scleral icterus.  Neck: Normal range of motion. Neck supple. No tracheal deviation present. No thyromegaly present.  Cardiovascular: Normal rate, regular rhythm and normal heart sounds.  Exam reveals no gallop and no friction rub.   No murmur heard. Pulmonary/Chest: Effort normal. No accessory muscle usage. No tachypnea. No respiratory distress. He has no decreased breath sounds. He  has wheezes (few scattered). He has no rhonchi. He has no rales. He exhibits no tenderness.  Musculoskeletal: Normal range of motion. He exhibits no edema.  Lymphadenopathy:    He has no cervical adenopathy.  Neurological: He is alert and oriented to person, place, and time. No cranial nerve deficit. Coordination normal.  Skin: Skin is warm and dry. No rash noted. He is not diaphoretic. No erythema. No pallor.  Psychiatric: He has a normal mood and affect. His behavior is normal. Judgment and thought  content normal.          Assessment & Plan:   Problem List Items Addressed This Visit      Unprioritized   COPD (chronic obstructive pulmonary disease) ([facility]) (Chronic)    Recent episode of acute bronchitis improved. Continue prn Albuterol. Follow up prn.      Hyperlipidemia (Chronic)    LFTs with labs today. Continue Simvastatin.      Hypertension - Primary (Chronic)    BP Readings from Last 3 Encounters:  06/14/15 122/77  06/07/15 110/74  05/26/15 138/72   BP well controlled. Renal function with labs. Continue Lisinopril.      Relevant Orders   Comprehensive metabolic panel   Microalbumin / creatinine urine ratio       Return in about 6 months (around 12/14/2015) for Physical.  Ronette Deter, MD Internal Medicine [facility]

## 2015-06-14 NOTE — Assessment & Plan Note (Signed)
BP Readings from Last 3 Encounters:  06/14/15 122/77  06/07/15 110/74  05/26/15 138/72   BP well controlled. Renal function with labs. Continue Lisinopril.

## 2015-06-14 NOTE — Progress Notes (Signed)
Pre visit review using our clinic review tool, if applicable. No additional management support is needed unless otherwise documented below in the visit note. 

## 2015-06-14 NOTE — Patient Instructions (Signed)
Labs today.  Follow up in 6 months and sooner as needed. 

## 2015-06-14 NOTE — Assessment & Plan Note (Signed)
LFTs with labs today. Continue Simvastatin.

## 2015-06-14 NOTE — Assessment & Plan Note (Signed)
Recent episode of acute bronchitis improved. Continue prn Albuterol. Follow up prn.

## 2015-06-23 ENCOUNTER — Ambulatory Visit (INDEPENDENT_AMBULATORY_CARE_PROVIDER_SITE_OTHER): Payer: Medicare Other

## 2015-06-23 VITALS — BP 130/70 | HR 61 | Temp 97.4°F | Resp 14 | Ht 65.0 in | Wt 178.0 lb

## 2015-06-23 DIAGNOSIS — Z0000 Encounter for general adult medical examination without abnormal findings: Secondary | ICD-10-CM

## 2015-06-23 NOTE — Patient Instructions (Addendum)
[name] , Thank you for taking time to come for your Medicare Wellness Visit. I appreciate your ongoing commitment to your health goals. Please review the following plan we discussed and let me know if I can assist you in the future.   CHECK WITH INSURANCE REGARDING THE ZOSTAVAX (SHINGLES VACCINE) AND TDAP (TETANUS VACCINE WITH PERTUSSIS)  Follow up with Dr. Gilford Rile as needed.   This is a list of the screening recommended for you and due dates:  Health Maintenance  Topic Date Due  . Shingles Vaccine  12/14/2015*  . Tetanus Vaccine  12/14/2015*  . Flu Shot  10/04/2015  . Colon Cancer Screening  12/05/2020  . Pneumonia vaccines  Completed  *Topic was postponed. The date shown is not the original due date.    Health Maintenance, Male A healthy lifestyle and preventative care can promote health and wellness.  Maintain regular health, dental, and eye exams.  Eat a healthy diet. Foods like vegetables, fruits, whole grains, low-fat dairy products, and lean protein foods contain the nutrients you need and are low in calories. Decrease your intake of foods high in solid fats, added sugars, and salt. Get information about a proper diet from your health care provider, if necessary.  Regular physical exercise is one of the most important things you can do for your health. Most adults should get at least 150 minutes of moderate-intensity exercise (any activity that increases your heart rate and causes you to sweat) each week. In addition, most adults need muscle-strengthening exercises on 2 or more days a week.   Maintain a healthy weight. The body mass index (BMI) is a screening tool to identify possible weight problems. It provides an estimate of body fat based on height and weight. Your health care provider can find your BMI and can help you achieve or maintain a healthy weight. For males 20 years and older:  A BMI below 18.5 is considered underweight.  A BMI of 18.5 to 24.9 is normal.  A  BMI of 25 to 29.9 is considered overweight.  A BMI of 30 and above is considered obese.  Maintain normal blood lipids and cholesterol by exercising and minimizing your intake of saturated fat. Eat a balanced diet with plenty of fruits and vegetables. Blood tests for lipids and cholesterol should begin at age 1 and be repeated every 5 years. If your lipid or cholesterol levels are high, you are over age 71, or you are at high risk for heart disease, you may need your cholesterol levels checked more frequently.Ongoing high lipid and cholesterol levels should be treated with medicines if diet and exercise are not working.  If you smoke, find out from your health care provider how to quit. If you do not use tobacco, do not start.  Lung cancer screening is recommended for adults aged 21-80 years who are at high risk for developing lung cancer because of a history of smoking. A yearly low-dose CT scan of the lungs is recommended for people who have at least a 30-pack-year history of smoking and are current smokers or have quit within the past 15 years. A pack year of smoking is smoking an average of 1 pack of cigarettes a day for 1 year (for example, a 30-pack-year history of smoking could mean smoking 1 pack a day for 30 years or 2 packs a day for 15 years). Yearly screening should continue until the smoker has stopped smoking for at least 15 years. Yearly screening should be stopped for  people who develop a health problem that would prevent them from having lung cancer treatment.  If you choose to drink alcohol, do not have more than 2 drinks per day. One drink is considered to be 12 oz (360 mL) of beer, 5 oz (150 mL) of wine, or 1.5 oz (45 mL) of liquor.  Avoid the use of street drugs. Do not share needles with anyone. Ask for help if you need support or instructions about stopping the use of drugs.  High blood pressure causes heart disease and increases the risk of stroke. High blood pressure is more  likely to develop in:  People who have blood pressure in the end of the normal range (100-139/85-89 mm Hg).  People who are overweight or obese.  People who are African American.  If you are 51-32 years of age, have your blood pressure checked every 3-5 years. If you are 5 years of age or older, have your blood pressure checked every year. You should have your blood pressure measured twice--once when you are at a hospital or clinic, and once when you are not at a hospital or clinic. Record the average of the two measurements. To check your blood pressure when you are not at a hospital or clinic, you can use:  An automated blood pressure machine at a pharmacy.  A home blood pressure monitor.  If you are 30-53 years old, ask your health care provider if you should take aspirin to prevent heart disease.  Diabetes screening involves taking a blood sample to check your fasting blood sugar level. This should be done once every 3 years after age 90 if you are at a normal weight and without risk factors for diabetes. Testing should be considered at a younger age or be carried out more frequently if you are overweight and have at least 1 risk factor for diabetes.  Colorectal cancer can be detected and often prevented. Most routine colorectal cancer screening begins at the age of 59 and continues through age 51. However, your health care provider may recommend screening at an earlier age if you have risk factors for colon cancer. On a yearly basis, your health care provider may provide home test kits to check for hidden blood in the stool. A small camera at the end of a tube may be used to directly examine the colon (sigmoidoscopy or colonoscopy) to detect the earliest forms of colorectal cancer. Talk to your health care provider about this at age 70 when routine screening begins. A direct exam of the colon should be repeated every 5-10 years through age 26, unless early forms of precancerous polyps or  small growths are found.  People who are at an increased risk for hepatitis B should be screened for this virus. You are considered at high risk for hepatitis B if:  You were born in a country where hepatitis B occurs often. Talk with your health care provider about which countries are considered high risk.  Your parents were born in a high-risk country and you have not received a shot to protect against hepatitis B (hepatitis B vaccine).  You have HIV or AIDS.  You use needles to inject street drugs.  You live with, or have sex with, someone who has hepatitis B.  You are a man who has sex with other men (MSM).  You get hemodialysis treatment.  You take certain medicines for conditions like cancer, organ transplantation, and autoimmune conditions.  Hepatitis C blood testing is recommended for all  people born from [birth year] through 1966 and any individual with known risk factors for hepatitis C.  Healthy men should no longer receive prostate-specific antigen (PSA) blood tests as part of routine cancer screening. Talk to your health care provider about prostate cancer screening.  Testicular cancer screening is not recommended for adolescents or adult males who have no symptoms. Screening includes self-exam, a health care provider exam, and other screening tests. Consult with your health care provider about any symptoms you have or any concerns you have about testicular cancer.  Practice safe sex. Use condoms and avoid high-risk sexual practices to reduce the spread of sexually transmitted infections (STIs).  You should be screened for STIs, including gonorrhea and chlamydia if:  You are sexually active and are younger than 24 years.  You are older than 24 years, and your health care provider tells you that you are at risk for this type of infection.  Your sexual activity has changed since you were last screened, and you are at an increased risk for chlamydia or gonorrhea. Ask your health  care provider if you are at risk.  If you are at risk of being infected with HIV, it is recommended that you take a prescription medicine daily to prevent HIV infection. This is called pre-exposure prophylaxis (PrEP). You are considered at risk if:  You are a man who has sex with other men (MSM).  You are a heterosexual man who is sexually active with multiple partners.  You take drugs by injection.  You are sexually active with a partner who has HIV.  Talk with your health care provider about whether you are at high risk of being infected with HIV. If you choose to begin PrEP, you should first be tested for HIV. You should then be tested every 3 months for as long as you are taking PrEP.  Use sunscreen. Apply sunscreen liberally and repeatedly throughout the day. You should seek shade when your shadow is shorter than you. Protect yourself by wearing long sleeves, pants, a wide-brimmed hat, and sunglasses year round whenever you are outdoors.  Tell your health care provider of new moles or changes in moles, especially if there is a change in shape or color. Also, tell your health care provider if a mole is larger than the size of a pencil eraser.  A one-time screening for abdominal aortic aneurysm (AAA) and surgical repair of large AAAs by ultrasound is recommended for men aged 53-75 years who are current or former smokers.  Stay current with your vaccines (immunizations).   This information is not intended to replace advice given to you by your health care provider. Make sure you discuss any questions you have with your health care provider.   Document Released: 08/18/2007 Document Revised: 03/12/2014 Document Reviewed: 07/17/2010 Elsevier Interactive Patient Education [employer].

## 2015-06-23 NOTE — Progress Notes (Signed)
Subjective:   Lucas Black is a 75 y.o. male who presents for Medicare Annual/Subsequent preventive examination.  Review of Systems:  No ROS.  Medicare Wellness Visit.  Cardiac Risk Factors include: advanced age (>31men, >80 women);male gender;hypertension     Objective:    Vitals: BP 130/70 mmHg  Pulse 61  Temp(Src) 97.4 F (36.3 C) (Oral)  Resp 14  Ht 5\' 5"  (1.651 m)  Wt 178 lb (80.74 kg)  BMI 29.62 kg/m2  SpO2 97%  Body mass index is 29.62 kg/(m^2).  Tobacco History  Smoking status  . Former Smoker  . Quit date: 03/31/1984  Smokeless tobacco  . Never Used     Counseling given: Not Answered   Past Medical History  Diagnosis Date  . Hyperlipidemia   . Hypertension   . Screening for colon cancer 2011    colonoscopy normal   Past Surgical History  Procedure Laterality Date  . Gastric resection  1973    secondary to ulcer  . Prostate surgery    . Cataract extraction Left 2015   Family History  Problem Relation Age of Onset  . Cancer Mother     colon cancer  . Heart disease Father   . Heart disease Brother   . Cancer Brother     skin   History  Sexual Activity  . Sexual Activity: No    Outpatient Encounter Prescriptions as of 06/23/2015  Medication Sig  . albuterol (PROVENTIL HFA;VENTOLIN HFA) 108 (90 Base) MCG/ACT inhaler Inhale 2 puffs into the lungs every 6 (six) hours as needed for wheezing or shortness of breath.  Marland Kitchen aspirin 81 MG tablet Take 81 mg by mouth daily.  Marland Kitchen doxycycline (VIBRA-TABS) 100 MG tablet Take 1 tablet (100 mg total) by mouth 2 (two) times daily.  . finasteride (PROSCAR) 5 MG tablet TAKE 1 TABLET DAILY  . lisinopril (PRINIVIL,ZESTRIL) 10 MG tablet TAKE 1 TABLET DAILY  . montelukast (SINGULAIR) 10 MG tablet Take 1 tablet (10 mg total) by mouth at bedtime.  . simvastatin (ZOCOR) 20 MG tablet TAKE 1 TABLET AT BEDTIME  . terazosin (HYTRIN) 5 MG capsule TAKE 1 CAPSULE AT BEDTIME   No facility-administered encounter medications on  file as of 06/23/2015.    Activities of Daily Living In your present state of health, do you have any difficulty performing the following activities: 06/23/2015  Hearing? Y  Vision? N  Difficulty concentrating or making decisions? N  Walking or climbing stairs? N  Dressing or bathing? N  Doing errands, shopping? N  Preparing Food and eating ? N  Using the Toilet? N  In the past six months, have you accidently leaked urine? N  Do you have problems with loss of bowel control? N  Managing your Medications? N  Managing your Finances? N  Housekeeping or managing your Housekeeping? N    Patient Care Team: Jackolyn Confer, MD as PCP - General (Internal Medicine)   Assessment:   This is a routine wellness examination for Lucas Black. The goal of the wellness visit is to assist the patient how to close the gaps in care and create a preventative care plan for the patient.   Osteoporosis risk reviewed.  Medications reviewed; taking without issues or barriers.  Safety issues reviewed; smoke detectors in the home. Firearms locked in a secure area within the home. Wears seatbelts when driving or riding with others. No violence in the home.  No identified risk were noted; The patient was oriented x 3; appropriate in  dress and manner and no objective failures at ADL's or IADL's.  ZOSTAVAX and TDAP vaccine postponed for follow up with insurance.  Patient Concerns:  None at this time.  Follow up with PCP as needed.  Exercise Activities and Dietary recommendations Current Exercise Habits: Home exercise routine (Yard work, mows grass, works in the garden), Time (Minutes): 20, Frequency (Times/Week): 4, Weekly Exercise (Minutes/Week): 80, Intensity: Intense  Goals    . Healthy Lifestyle     Stay hydrated and drink plenty of fluids. Low carb foods.  Lean meats and vegetable. Stay active and continue exercising.      Fall Risk Fall Risk  06/23/2015 12/23/2013 12/19/2012  Falls in the past  year? No No No   Depression Screen PHQ 2/9 Scores 06/23/2015 12/23/2013 12/19/2012  PHQ - 2 Score 0 0 0    Cognitive Testing MMSE - Mini Mental State Exam 06/23/2015  Orientation to time 5  Orientation to Place 5  Registration 3  Attention/ Calculation 5  Recall 3  Language- name 2 objects 2  Language- repeat 1  Language- follow 3 step command 3  Language- read & follow direction 1  Write a sentence 1  Copy design 1  Total score 30    Immunization History  Administered Date(s) Administered  . Influenza Split 11/30/2010  . Influenza,inj,Quad PF,36+ Mos 12/19/2012, 12/23/2013, 12/15/2014  . Pneumococcal Conjugate-13 06/23/2013  . Pneumococcal Polysaccharide-23 12/06/2007   Screening Tests Health Maintenance  Topic Date Due  . ZOSTAVAX  12/14/2015 (Originally 10/08/1999)  . TETANUS/TDAP  12/14/2015 (Originally 10/[birth year]/1958)  . INFLUENZA VACCINE  10/04/2015  . COLONOSCOPY  12/05/2020  . PNA vac Low Risk Adult  Completed      Plan:   End of life planning; Advance aging; Advanced directives discussed. Educational material provided to help him start the conversation with his family.  Copy of forms requested upon completion.  Time spent discussing HCPOA/Living Will short forms is 20 minutes.     During the course of the visit the patient was educated and counseled about the following appropriate screening and preventive services:   Vaccines to include Pneumoccal, Influenza, Hepatitis B, Td, Zostavax, HCV  Electrocardiogram  Cardiovascular Disease  Colorectal cancer screening  Diabetes screening  Prostate Cancer Screening  Glaucoma screening  Nutrition counseling   Smoking cessation counseling  Patient Instructions (the written plan) was given to the patient.    Varney Biles, LPN  [id number]

## 2015-06-25 NOTE — Progress Notes (Signed)
Annual Wellness Visit as completed by Health Coach was reviewed in full.  

## 2015-07-07 ENCOUNTER — Encounter: Payer: Self-pay | Admitting: Internal Medicine

## 2015-07-08 ENCOUNTER — Ambulatory Visit (INDEPENDENT_AMBULATORY_CARE_PROVIDER_SITE_OTHER): Payer: Medicare Other | Admitting: Internal Medicine

## 2015-07-08 VITALS — BP 142/76 | HR 84 | Wt 177.4 lb

## 2015-07-08 DIAGNOSIS — J441 Chronic obstructive pulmonary disease with (acute) exacerbation: Secondary | ICD-10-CM

## 2015-07-08 HISTORY — DX: Chronic obstructive pulmonary disease with (acute) exacerbation: J44.1

## 2015-07-08 MED ORDER — DOXYCYCLINE HYCLATE 100 MG PO TABS: 100.0000 mg | ORAL_TABLET | Freq: Two times a day (BID) | ORAL | Status: DC

## 2015-07-08 MED ORDER — BUDESONIDE-FORMOTEROL FUMARATE 80-4.5 MCG/ACT IN AERO: 2.0000 | INHALATION_SPRAY | Freq: Two times a day (BID) | RESPIRATORY_TRACT | Status: DC

## 2015-07-08 MED ORDER — HYDROCOD POLST-CPM POLST ER 10-8 MG/5ML PO SUER: 5.0000 mL | Freq: Two times a day (BID) | ORAL | Status: DC | PRN

## 2015-07-08 MED ORDER — PREDNISONE 10 MG PO TABS: ORAL_TABLET | ORAL | Status: DC

## 2015-07-08 NOTE — Progress Notes (Signed)
Pre visit review using our clinic review tool, if applicable. No additional management support is needed unless otherwise documented below in the visit note. 

## 2015-07-08 NOTE — Progress Notes (Signed)
Subjective:    Patient ID: Lucas Black, male    DOB: [birth year], 75 y.o.   MRN: [id number]  HPI  75YO male presents for acute visit.  Recently seen for COPD exacerbation. CXR was normal. Treated with prednisone and doxycycline.  Initially felt better, then last weekend, on Sunday after being outside all day, developed more shortness of breath and cough. Notes some wheezing. No chest pain. No fever, chills. Using Albuterol twice daily to 3 times daily.    Wt Readings from Last 3 Encounters:  07/08/15 177 lb 6.4 oz (80.468 kg)  06/23/15 178 lb (80.74 kg)  06/14/15 178 lb (80.74 kg)   BP Readings from Last 3 Encounters:  07/08/15 142/76  06/23/15 130/70  06/14/15 122/77    Past Medical History  Diagnosis Date  . Hyperlipidemia   . Hypertension   . Screening for colon cancer 2011    colonoscopy normal   Family History  Problem Relation Age of Onset  . Cancer Mother     colon cancer  . Heart disease Father   . Heart disease Brother   . Cancer Brother     skin   Past Surgical History  Procedure Laterality Date  . Gastric resection  1973    secondary to ulcer  . Prostate surgery    . Cataract extraction Left 2015   Social History   Social History  . Marital Status: Married    Spouse Name: N/A  . Number of Children: N/A  . Years of Education: N/A   Social History Main Topics  . Smoking status: Former Smoker    Quit date: 03/31/1984  . Smokeless tobacco: Never Used  . Alcohol Use: Yes     Comment: rarely  . Drug Use: No  . Sexual Activity: No   Other Topics Concern  . Not on file   Social History Narrative    Review of Systems  Constitutional: Positive for fatigue. Negative for fever, chills and activity change.  HENT: Negative for congestion, ear discharge, ear pain, hearing loss, nosebleeds, postnasal drip, rhinorrhea, sinus pressure, sneezing, sore throat, tinnitus, trouble swallowing and voice change.   Eyes: Negative for discharge, redness,  itching and visual disturbance.  Respiratory: Positive for cough, chest tightness, shortness of breath and wheezing. Negative for stridor.   Cardiovascular: Negative for chest pain and leg swelling.  Musculoskeletal: Negative for myalgias, arthralgias, neck pain and neck stiffness.  Skin: Negative for color change and rash.  Neurological: Negative for dizziness, facial asymmetry and headaches.  Psychiatric/Behavioral: Positive for sleep disturbance.       Objective:    BP 142/76 mmHg  Pulse 84  Wt 177 lb 6.4 oz (80.468 kg)  SpO2 96% Physical Exam  Constitutional: He is oriented to person, place, and time. He appears well-developed and well-nourished. No distress.  HENT:  Head: Normocephalic and atraumatic.  Right Ear: External ear normal.  Left Ear: External ear normal.  Nose: Nose normal.  Mouth/Throat: Oropharynx is clear and moist. No oropharyngeal exudate.  Eyes: Conjunctivae and EOM are normal. Pupils are equal, round, and reactive to light. Right eye exhibits no discharge. Left eye exhibits no discharge. No scleral icterus.  Neck: Normal range of motion. Neck supple. No tracheal deviation present. No thyromegaly present.  Cardiovascular: Normal rate, regular rhythm and normal heart sounds.  Exam reveals no gallop and no friction rub.   No murmur heard. Pulmonary/Chest: Effort normal. No accessory muscle usage. No tachypnea. No respiratory distress. He has no decreased  breath sounds. He has wheezes (scattered). He has rhonchi (scattered). He has no rales. He exhibits no tenderness.  Musculoskeletal: Normal range of motion. He exhibits no edema.  Lymphadenopathy:    He has no cervical adenopathy.  Neurological: He is alert and oriented to person, place, and time. No cranial nerve deficit. Coordination normal.  Skin: Skin is warm and dry. No rash noted. He is not diaphoretic. No erythema. No pallor.  Psychiatric: He has a normal mood and affect. His behavior is normal. Judgment  and thought content normal.          Assessment & Plan:   Problem List Items Addressed This Visit      Unprioritized   COPD exacerbation ([place]) - Primary    Symptoms and exam c/w COPD exacerbation. Likely triggered by seasonal allergies. Will start Prednisone taper and Doxycycline. Add Symbicort. Continue prn Albuterol. Tussionex prn cough. Discussed potential risks of these medications. Discussed referral to Pulmonary for PFTs and evaluation, but he declines for now. Recheck next week and prn.      Relevant Medications   chlorpheniramine-HYDROcodone (TUSSIONEX PENNKINETIC ER) 10-8 MG/5ML SUER   doxycycline (VIBRA-TABS) 100 MG tablet   predniSONE (DELTASONE) 10 MG tablet   budesonide-formoterol (SYMBICORT) 80-4.5 MCG/ACT inhaler       Return in about 1 week (around 07/15/2015) for Recheck.  Ronette Deter, MD Internal Medicine [facility]

## 2015-07-08 NOTE — Patient Instructions (Signed)
Start Prednisone taper and Doxycyline.  Start Symbicort inhaled twice daily.  Use Albuterol as needed for shortness of breath.  Use Tussionex as needed for cough.  Follow up in 1 weeks.

## 2015-07-08 NOTE — Assessment & Plan Note (Signed)
Symptoms and exam c/w COPD exacerbation. Likely triggered by seasonal allergies. Will start Prednisone taper and Doxycycline. Add Symbicort. Continue prn Albuterol. Tussionex prn cough. Discussed potential risks of these medications. Discussed referral to Pulmonary for PFTs and evaluation, but he declines for now. Recheck next week and prn.

## 2015-07-13 ENCOUNTER — Other Ambulatory Visit: Payer: Self-pay | Admitting: Internal Medicine

## 2015-07-15 ENCOUNTER — Ambulatory Visit (INDEPENDENT_AMBULATORY_CARE_PROVIDER_SITE_OTHER): Payer: Medicare Other | Admitting: Internal Medicine

## 2015-07-15 VITALS — BP 114/80 | Temp 98.0°F | Ht 65.0 in | Wt 177.4 lb

## 2015-07-15 DIAGNOSIS — J441 Chronic obstructive pulmonary disease with (acute) exacerbation: Secondary | ICD-10-CM

## 2015-07-15 DIAGNOSIS — J302 Other seasonal allergic rhinitis: Secondary | ICD-10-CM | POA: Diagnosis not present

## 2015-07-15 MED ORDER — BUDESONIDE-FORMOTEROL FUMARATE 80-4.5 MCG/ACT IN AERO: 2.0000 | INHALATION_SPRAY | Freq: Two times a day (BID) | RESPIRATORY_TRACT | Status: DC

## 2015-07-15 NOTE — Assessment & Plan Note (Signed)
Exam much improved today. Residual cough likely related to seasonal allergies. Discussed using Claritin during the day and Benadryl at night, and adding Flonase. Will continue Symbicort and prn Albuterol. He is not interested in pulmonary evaluation at this time.Follow up in 4 weeks and prn.

## 2015-07-15 NOTE — Patient Instructions (Signed)
Email or call with update when you are back from vacation.

## 2015-07-15 NOTE — Progress Notes (Signed)
Subjective:    Patient ID: Lucas Black, male    DOB: [birth year], 75 y.o.   MRN: [id number]  HPI  75YO male presents for follow up.  Last seen 5/5 for bronchitis. Treated with Prednisone and Doxycycline.  Feeling better. Continues to cough up some mucous. No fever, chills, dyspnea, chest pain. Using Symbicort and Albuterol as needed. Completed Prednisone taper. Has 2 days left on Doxycycline.  Wt Readings from Last 3 Encounters:  07/15/15 177 lb 6.4 oz (80.468 kg)  07/08/15 177 lb 6.4 oz (80.468 kg)  06/23/15 178 lb (80.74 kg)   BP Readings from Last 3 Encounters:  07/15/15 114/80  07/08/15 142/76  06/23/15 130/70    Past Medical History  Diagnosis Date  . Hyperlipidemia   . Hypertension   . Screening for colon cancer 2011    colonoscopy normal   Family History  Problem Relation Age of Onset  . Cancer Mother     colon cancer  . Heart disease Father   . Heart disease Brother   . Cancer Brother     skin   Past Surgical History  Procedure Laterality Date  . Gastric resection  1973    secondary to ulcer  . Prostate surgery    . Cataract extraction Left 2015   Social History   Social History  . Marital Status: Married    Spouse Name: N/A  . Number of Children: N/A  . Years of Education: N/A   Social History Main Topics  . Smoking status: Former Smoker    Quit date: 03/31/1984  . Smokeless tobacco: Never Used  . Alcohol Use: Yes     Comment: rarely  . Drug Use: No  . Sexual Activity: No   Other Topics Concern  . Not on file   Social History Narrative    Review of Systems  Constitutional: Negative for fever, chills, activity change and fatigue.  HENT: Positive for congestion, postnasal drip, rhinorrhea and sneezing. Negative for ear discharge, ear pain, hearing loss, nosebleeds, sinus pressure, sore throat, tinnitus, trouble swallowing and voice change.   Eyes: Negative for discharge, redness, itching and visual disturbance.  Respiratory: Positive  for cough. Negative for chest tightness, shortness of breath, wheezing and stridor.   Cardiovascular: Negative for chest pain and leg swelling.  Musculoskeletal: Negative for myalgias, arthralgias, neck pain and neck stiffness.  Skin: Negative for color change and rash.  Neurological: Negative for dizziness, facial asymmetry and headaches.  Psychiatric/Behavioral: Negative for sleep disturbance.       Objective:    BP 114/80 mmHg  Temp(Src) 98 F (36.7 C) (Oral)  Ht 5\' 5"  (1.651 m)  Wt 177 lb 6.4 oz (80.468 kg)  BMI 29.52 kg/m2  SpO2 97% Physical Exam  Constitutional: He is oriented to person, place, and time. He appears well-developed and well-nourished. No distress.  HENT:  Head: Normocephalic and atraumatic.  Right Ear: External ear normal.  Left Ear: External ear normal.  Nose: Nose normal.  Mouth/Throat: Oropharynx is clear and moist.  Eyes: Conjunctivae and EOM are normal. Pupils are equal, round, and reactive to light. Right eye exhibits no discharge. Left eye exhibits no discharge. No scleral icterus.  Neck: Normal range of motion. Neck supple. No tracheal deviation present. No thyromegaly present.  Cardiovascular: Normal rate, regular rhythm and normal heart sounds.  Exam reveals no gallop and no friction rub.   No murmur heard. Pulmonary/Chest: Effort normal and breath sounds normal. No accessory muscle usage. No tachypnea. No respiratory  distress. He has no decreased breath sounds. He has no wheezes. He has no rhonchi. He has no rales. He exhibits no tenderness.  Musculoskeletal: Normal range of motion. He exhibits no edema.  Lymphadenopathy:    He has no cervical adenopathy.  Neurological: He is alert and oriented to person, place, and time. No cranial nerve deficit. Coordination normal.  Skin: Skin is warm and dry. No rash noted. He is not diaphoretic. No erythema. No pallor.  Psychiatric: He has a normal mood and affect. His behavior is normal. Judgment and thought  content normal.          Assessment & Plan:   Problem List Items Addressed This Visit      Unprioritized   COPD exacerbation ([place]) - Primary    Exam much improved today. Residual cough likely related to seasonal allergies. Discussed using Claritin during the day and Benadryl at night, and adding Flonase. Will continue Symbicort and prn Albuterol. He is not interested in pulmonary evaluation at this time.Follow up in 4 weeks and prn.      Relevant Medications   budesonide-formoterol (SYMBICORT) 80-4.5 MCG/ACT inhaler   Seasonal allergies       Return in about 4 weeks (around 08/12/2015) for Recheck.  Ronette Deter, MD [facility]

## 2015-07-15 NOTE — Progress Notes (Signed)
Pre visit review using our clinic review tool, if applicable. No additional management support is needed unless otherwise documented below in the visit note. 

## 2015-07-18 ENCOUNTER — Other Ambulatory Visit: Payer: Self-pay | Admitting: Internal Medicine

## 2015-08-17 ENCOUNTER — Ambulatory Visit: Payer: Medicare Other | Admitting: Internal Medicine

## 2015-09-14 ENCOUNTER — Encounter: Payer: Self-pay | Admitting: Internal Medicine

## 2015-11-21 DIAGNOSIS — Z23 Encounter for immunization: Secondary | ICD-10-CM | POA: Diagnosis not present

## 2015-11-28 ENCOUNTER — Other Ambulatory Visit: Payer: Self-pay | Admitting: Surgical

## 2015-11-28 MED ORDER — LISINOPRIL 10 MG PO TABS: 10.0000 mg | ORAL_TABLET | Freq: Every day | ORAL | 0 refills | Status: DC

## 2015-11-30 ENCOUNTER — Other Ambulatory Visit: Payer: Self-pay | Admitting: Surgical

## 2015-11-30 MED ORDER — SIMVASTATIN 20 MG PO TABS: 20.0000 mg | ORAL_TABLET | Freq: Every day | ORAL | 0 refills | Status: DC

## 2015-12-14 ENCOUNTER — Ambulatory Visit: Payer: Medicare Other | Admitting: Internal Medicine

## 2015-12-14 ENCOUNTER — Ambulatory Visit: Payer: Medicare Other | Admitting: Family Medicine

## 2016-01-24 ENCOUNTER — Ambulatory Visit (INDEPENDENT_AMBULATORY_CARE_PROVIDER_SITE_OTHER): Payer: Medicare Other | Admitting: Family Medicine

## 2016-01-24 ENCOUNTER — Encounter: Payer: Self-pay | Admitting: Family Medicine

## 2016-01-24 VITALS — BP 132/82 | HR 66 | Temp 97.7°F | Resp 14 | Wt 187.4 lb

## 2016-01-24 DIAGNOSIS — Z125 Encounter for screening for malignant neoplasm of prostate: Secondary | ICD-10-CM | POA: Diagnosis not present

## 2016-01-24 DIAGNOSIS — E785 Hyperlipidemia, unspecified: Secondary | ICD-10-CM

## 2016-01-24 DIAGNOSIS — I10 Essential (primary) hypertension: Secondary | ICD-10-CM

## 2016-01-24 DIAGNOSIS — J449 Chronic obstructive pulmonary disease, unspecified: Secondary | ICD-10-CM

## 2016-01-24 DIAGNOSIS — N400 Enlarged prostate without lower urinary tract symptoms: Secondary | ICD-10-CM | POA: Diagnosis not present

## 2016-01-24 LAB — COMPREHENSIVE METABOLIC PANEL
ALK PHOS: 83 U/L (ref 39–117)
ALT: 16 U/L (ref 0–53)
AST: 20 U/L (ref 0–37)
Albumin: 4.6 g/dL (ref 3.5–5.2)
BILIRUBIN TOTAL: 0.8 mg/dL (ref 0.2–1.2)
BUN: 20 mg/dL (ref 6–23)
CALCIUM: 10 mg/dL (ref 8.4–10.5)
CO2: 28 mEq/L (ref 19–32)
Chloride: 105 mEq/L (ref 96–112)
Creatinine, Ser: 1.33 mg/dL (ref 0.40–1.50)
GFR: 55.52 mL/min — AB (ref >60.00)
Glucose, Bld: 106 mg/dL — ABNORMAL HIGH (ref 70–99)
Potassium: 4.8 mEq/L (ref 3.5–5.1)
Sodium: 139 mEq/L (ref 135–145)
TOTAL PROTEIN: 6.8 g/dL (ref 6.0–8.3)

## 2016-01-24 LAB — LIPID PANEL
Cholesterol: 156 mg/dL (ref 0–200)
HDL: 57.4 mg/dL (ref >39.00)
LDL Cholesterol: 81 mg/dL (ref 0–99)
NonHDL: 99.09
TRIGLYCERIDES: 89 mg/dL (ref 0.0–149.0)
Total CHOL/HDL Ratio: 3
VLDL: 17.8 mg/dL (ref 0.0–40.0)

## 2016-01-24 LAB — PSA, MEDICARE: PSA: 0.35 ng/ml (ref 0.10–4.00)

## 2016-01-24 MED ORDER — BUDESONIDE-FORMOTEROL FUMARATE 80-4.5 MCG/ACT IN AERO: 2.0000 | INHALATION_SPRAY | Freq: Two times a day (BID) | RESPIRATORY_TRACT | 3 refills | Status: DC

## 2016-01-24 MED ORDER — SIMVASTATIN 20 MG PO TABS: 20.0000 mg | ORAL_TABLET | Freq: Every day | ORAL | 3 refills | Status: DC

## 2016-01-24 MED ORDER — ALBUTEROL SULFATE HFA 108 (90 BASE) MCG/ACT IN AERS: 2.0000 | INHALATION_SPRAY | Freq: Four times a day (QID) | RESPIRATORY_TRACT | 1 refills | Status: DC | PRN

## 2016-01-24 MED ORDER — MONTELUKAST SODIUM 10 MG PO TABS: 10.0000 mg | ORAL_TABLET | Freq: Every day | ORAL | 3 refills | Status: DC

## 2016-01-24 MED ORDER — LISINOPRIL 10 MG PO TABS: 10.0000 mg | ORAL_TABLET | Freq: Every day | ORAL | 3 refills | Status: DC

## 2016-01-24 MED ORDER — TERAZOSIN HCL 5 MG PO CAPS: 5.0000 mg | ORAL_CAPSULE | Freq: Every day | ORAL | 3 refills | Status: DC

## 2016-01-24 MED ORDER — FINASTERIDE 5 MG PO TABS: 5.0000 mg | ORAL_TABLET | Freq: Every day | ORAL | 3 refills | Status: DC

## 2016-01-24 NOTE — Progress Notes (Signed)
Tommi Rumps, MD Phone: [phone]  Lucas Black is a 75 y.o. male who presents today for f/u.  HYPERTENSION Disease Monitoring: Blood pressure range-not checking Chest pain- no      Dyspnea- no Medications: Compliance- taking lisinopril 5 mg daily, takes an aspirin as well Lightheadedness- no   Edema- no  HYPERLIPIDEMIA Disease Monitoring: See symptoms for Hypertension Medications: Compliance- taking simvastatin Right upper quadrant pain- no  Muscle aches- no  COPD: Notes this is pretty stable. He had 2 exacerbations earlier this year though has done well since then. He notes no cough. He notes some wheezing at times. Rarely uses his albuterol. He is using his Symbicort.  BPH: Taking Proscar and terazosin. Notes urinating 4-5 times a day. 2 times at night. No urgency. Some issues with starting and stopping of urinary stream. Feels as though he empties his bladder fully. He is happy with his current level of control.   PMH: Former smoker   ROS see history of present illness  Objective  Physical Exam Vitals:   01/24/16 0756  BP: 132/82  Pulse: 66  Resp: 14  Temp: 97.7 F (36.5 C)    BP Readings from Last 3 Encounters:  01/24/16 132/82  07/15/15 114/80  07/08/15 (!) 142/76   Wt Readings from Last 3 Encounters:  01/24/16 187 lb 6 oz (85 kg)  07/15/15 177 lb 6.4 oz (80.5 kg)  07/08/15 177 lb 6.4 oz (80.5 kg)    Physical Exam  Constitutional: He is well-developed, well-nourished, and in no distress.  HENT:  Mouth/Throat: Oropharynx is clear and moist. No oropharyngeal exudate.  Eyes: Conjunctivae are normal. Pupils are equal, round, and reactive to light.  Cardiovascular: Normal rate, regular rhythm and normal heart sounds.   Pulmonary/Chest: Effort normal and breath sounds normal.  Abdominal: Soft. He exhibits no distension. There is no tenderness.  Musculoskeletal: He exhibits no edema.  Neurological: He is alert. Gait normal.  Skin: Skin is warm and  dry.     Assessment/Plan: Please see individual problem list.  Hypertension Well-controlled. Continue lisinopril. Check lab work as outlined below.  COPD (chronic obstructive pulmonary disease) (HCC) Currently asymptomatic. Benign lung exam. He'll continue Symbicort. As needed albuterol. Refills provided.  Hyperlipidemia Tolerating simvastatin. Refill provided. Lipid panel today.  Prostatic enlargement Patient reports relatively well controlled symptoms. He is happy with the level of control on Proscar and terazosin. These will be refilled. We'll check a PSA today.   Orders Placed This Encounter  Procedures  . PSA, Medicare  . Comp Met (CMET)  . Lipid Profile    Meds ordered this encounter  Medications  . albuterol (PROVENTIL HFA;VENTOLIN HFA) 108 (90 Base) MCG/ACT inhaler    Sig: Inhale 2 puffs into the lungs every 6 (six) hours as needed for wheezing or shortness of breath.    Dispense:  3 Inhaler    Refill:  1  . budesonide-formoterol (SYMBICORT) 80-4.5 MCG/ACT inhaler    Sig: Inhale 2 puffs into the lungs 2 (two) times daily.    Dispense:  3 Inhaler    Refill:  3  . finasteride (PROSCAR) 5 MG tablet    Sig: Take 1 tablet (5 mg total) by mouth daily.    Dispense:  90 tablet    Refill:  3  . lisinopril (PRINIVIL,ZESTRIL) 10 MG tablet    Sig: Take 1 tablet (10 mg total) by mouth daily.    Dispense:  90 tablet    Refill:  3  . montelukast (SINGULAIR) 10  MG tablet    Sig: Take 1 tablet (10 mg total) by mouth at bedtime.    Dispense:  90 tablet    Refill:  3  . simvastatin (ZOCOR) 20 MG tablet    Sig: Take 1 tablet (20 mg total) by mouth at bedtime.    Dispense:  90 tablet    Refill:  3  . terazosin (HYTRIN) 5 MG capsule    Sig: Take 1 capsule (5 mg total) by mouth at bedtime.    Dispense:  90 capsule    Refill:  [place], MD [place]

## 2016-01-24 NOTE — Assessment & Plan Note (Signed)
Tolerating simvastatin. Refill provided. Lipid panel today.

## 2016-01-24 NOTE — Assessment & Plan Note (Signed)
Well-controlled. Continue lisinopril. Check lab work as outlined below.

## 2016-01-24 NOTE — Assessment & Plan Note (Signed)
Patient reports relatively well controlled symptoms. He is happy with the level of control on Proscar and terazosin. These will be refilled. We'll check a PSA today.

## 2016-01-24 NOTE — Patient Instructions (Signed)
Nice is to see you. We'll check lab work today and call you with the results. Please continue current medications. I will see you back in 6 months.

## 2016-01-24 NOTE — Assessment & Plan Note (Signed)
Currently asymptomatic. Benign lung exam. He'll continue Symbicort. As needed albuterol. Refills provided.

## 2016-05-17 DIAGNOSIS — H538 Other visual disturbances: Secondary | ICD-10-CM | POA: Diagnosis not present

## 2016-05-17 DIAGNOSIS — H26493 Other secondary cataract, bilateral: Secondary | ICD-10-CM | POA: Diagnosis not present

## 2016-05-17 DIAGNOSIS — H353131 Nonexudative age-related macular degeneration, bilateral, early dry stage: Secondary | ICD-10-CM | POA: Diagnosis not present

## 2016-06-22 ENCOUNTER — Ambulatory Visit (INDEPENDENT_AMBULATORY_CARE_PROVIDER_SITE_OTHER): Payer: Medicare Other

## 2016-06-22 VITALS — BP 128/72 | HR 73 | Temp 97.7°F | Resp 14 | Ht 65.0 in | Wt 186.8 lb

## 2016-06-22 DIAGNOSIS — Z0000 Encounter for general adult medical examination without abnormal findings: Secondary | ICD-10-CM

## 2016-06-22 NOTE — Progress Notes (Signed)
I have reviewed the above note and agree.  Darlene Bartelt, M.D.  

## 2016-06-22 NOTE — Progress Notes (Signed)
Subjective:   Lucas Black is a 76 y.o. male who presents for Medicare Annual/Subsequent preventive examination.  Review of Systems:  No ROS.  Medicare Wellness Visit.  Cardiac Risk Factors include: advanced age (>84men, >82 women);hypertension;male gender     Objective:    Vitals: BP 128/72 (BP Location: Left Arm, Patient Position: Sitting, Cuff Size: Normal)   Pulse 73   Temp 97.7 F (36.5 C) (Oral)   Resp 14   Ht 5\' 5"  (1.651 m)   Wt 186 lb 12.8 oz (84.7 kg)   SpO2 95%   BMI 31.09 kg/m   Body mass index is 31.09 kg/m.  Tobacco History  Smoking Status  . Former Smoker  . Quit date: 03/31/1984  Smokeless Tobacco  . Never Used     Counseling given: Not Answered   Past Medical History:  Diagnosis Date  . Hyperlipidemia   . Hypertension   . Screening for colon cancer 2011   colonoscopy normal   Past Surgical History:  Procedure Laterality Date  . CATARACT EXTRACTION Left 2015  . GASTRIC RESECTION  1973   secondary to ulcer  . PROSTATE SURGERY     Family History  Problem Relation Age of Onset  . Cancer Mother     colon cancer  . Heart disease Father   . Heart disease Brother   . Cancer Brother     skin   History  Sexual Activity  . Sexual activity: No    Outpatient Encounter Prescriptions as of 06/22/2016  Medication Sig  . albuterol (PROVENTIL HFA;VENTOLIN HFA) 108 (90 Base) MCG/ACT inhaler Inhale 2 puffs into the lungs every 6 (six) hours as needed for wheezing or shortness of breath.  Marland Kitchen aspirin 81 MG tablet Take 81 mg by mouth daily.  . budesonide-formoterol (SYMBICORT) 80-4.5 MCG/ACT inhaler Inhale 2 puffs into the lungs 2 (two) times daily.  . finasteride (PROSCAR) 5 MG tablet Take 1 tablet (5 mg total) by mouth daily.  Marland Kitchen lisinopril (PRINIVIL,ZESTRIL) 10 MG tablet Take 1 tablet (10 mg total) by mouth daily.  . montelukast (SINGULAIR) 10 MG tablet Take 1 tablet (10 mg total) by mouth at bedtime.  . simvastatin (ZOCOR) 20 MG tablet Take 1 tablet  (20 mg total) by mouth at bedtime.  Marland Kitchen terazosin (HYTRIN) 5 MG capsule Take 1 capsule (5 mg total) by mouth at bedtime.   No facility-administered encounter medications on file as of 06/22/2016.     Activities of Daily Living In your present state of health, do you have any difficulty performing the following activities: 06/22/2016 06/23/2015  Hearing? Tempie Donning  Vision? N N  Difficulty concentrating or making decisions? N N  Walking or climbing stairs? N N  Dressing or bathing? N N  Doing errands, shopping? N N  Preparing Food and eating ? N N  Using the Toilet? N N  In the past six months, have you accidently leaked urine? N N  Do you have problems with loss of bowel control? N N  Managing your Medications? N N  Managing your Finances? N N  Housekeeping or managing your Housekeeping? N N  Some recent data might be hidden    Patient Care Team: Lucas Haven, MD as PCP - General (Family Medicine)   Assessment:    This is a routine wellness examination for Lucas Black. The goal of the wellness visit is to assist the patient how to close the gaps in care and create a preventative care plan for the patient.  Osteoporosis risk reviewed.  Medications reviewed; taking without issues or barriers.  Safety issues reviewed; smoke detectors in the home. Firearms locked up in the home. Wears seatbelts when driving or riding with others. Patient does wear sunscreen or protective clothing when in direct sunlight. No violence in the home.  Patient is alert, normal appearance, oriented to person/place/and time. Correctly identified the president of the [place], recall of 3/3 words, and performing simple calculations.  Patient displays appropriate judgement and can read correct time from watch face.  No new identified risk were noted.  No failures at ADL's or IADL's.   BMI- discussed the importance of a healthy diet, water intake and exercise. Educational material provided.   Daily fluid intake: 4  cups of caffeine, 2 cups of water  HTN- followed by PCP.  Dental- every six months.  Wears dentures.  Eye- Visual acuity not assessed per patient preference since they have regular follow up with the ophthalmologist.  Wears corrective lenses when reading.  Sleep patterns- Sleeps 8 hours at night.  Wakes feeling rested.  TDAP vaccine deferred per patient preference.  Educational material provided.  Patient Concerns: None at this time. Follow up with PCP as needed.  Exercise Activities and Dietary recommendations Current Exercise Habits: Home exercise routine, Type of exercise: walking (Active around the home. Yard work.), Time (Minutes): 20, Frequency (Times/Week): 4, Weekly Exercise (Minutes/Week): 80, Intensity: Mild  Goals    . Healthy Lifestyle          Stay hydrated and drink plenty of fluids.  Increase water intake. Low carb foods.  Lean meats and vegetable. Stay active and continue walking and staying active around the home for exercise.        Fall Risk Fall Risk  06/22/2016 06/23/2015 12/23/2013 12/19/2012  Falls in the past year? No No No No   Depression Screen PHQ 2/9 Scores 06/22/2016 06/23/2015 12/23/2013 12/19/2012  PHQ - 2 Score 0 0 0 0    Cognitive Function MMSE - Mini Mental State Exam 06/22/2016 06/23/2015  Orientation to time 5 5  Orientation to Place 5 5  Registration 3 3  Attention/ Calculation 5 5  Recall 3 3  Language- name 2 objects 2 2  Language- repeat 1 1  Language- follow 3 step command 3 3  Language- read & follow direction 1 1  Write a sentence 1 1  Copy design 1 1  Total score 30 30        Immunization History  Administered Date(s) Administered  . Influenza Split 11/30/2010  . Influenza,inj,Quad PF,36+ Mos 12/19/2012, 12/23/2013, 12/15/2014  . Influenza-Unspecified 11/23/2015  . Pneumococcal Conjugate-13 06/23/2013  . Pneumococcal Polysaccharide-23 12/06/2007   Screening Tests Health Maintenance  Topic Date Due  . TETANUS/TDAP   06/03/2017 (Originally 10/08/1959)  . INFLUENZA VACCINE  10/03/2016  . PNA vac Low Risk Adult  Completed      Plan:   End of life planning; Advanced aging; Advanced directives discussed.  No HCPOA/Living Will.  Additional information declined at this time.  Medicare Attestation I have personally reviewed: The patient's medical and social history Their use of alcohol, tobacco or illicit drugs Their current medications and supplements The patient's functional ability including ADLs,fall risks, home safety risks, cognitive, and hearing and visual impairment Diet and physical activities Evidence for depression   The patient's weight, height, BMI, and visual acuity have been recorded in the chart.  I have made referrals and provided education to the patient based on review of the above  and I have provided the patient with a written personalized care plan for preventive services.    During the course of the visit the patient was educated and counseled about the following appropriate screening and preventive services:   Vaccines to include Pneumoccal, Influenza, Hepatitis B, Td, Zostavax, HCV  Colorectal cancer screening-UTD  Prostate Cancer Screening-01/24/16, wnl  Glaucoma screening-annual eye  Nutrition counseling   Patient Instructions (the written plan) was given to the patient.    Varney Biles, LPN  9/37/9024

## 2016-06-22 NOTE — Patient Instructions (Addendum)
  Lucas Black , Thank you for taking time to come for your Medicare Wellness Visit. I appreciate your ongoing commitment to your health goals. Please review the following plan we discussed and let me know if I can assist you in the future.   Follow up with Dr. Caryl Bis as needed.    Have a great day!  These are the goals we discussed: Goals    . Healthy Lifestyle          Stay hydrated and drink plenty of fluids.  Increase water intake. Low carb foods.  Lean meats and vegetable. Stay active and continue walking and staying active around the home for exercise.         This is a list of the screening recommended for you and due dates:  Health Maintenance  Topic Date Due  . Tetanus Vaccine  06/03/2017*  . Flu Shot  10/03/2016  . Pneumonia vaccines  Completed  *Topic was postponed. The date shown is not the original due date.

## 2016-07-23 ENCOUNTER — Encounter: Payer: Self-pay | Admitting: Family Medicine

## 2016-07-23 ENCOUNTER — Other Ambulatory Visit: Payer: Self-pay | Admitting: Family Medicine

## 2016-07-23 ENCOUNTER — Ambulatory Visit (INDEPENDENT_AMBULATORY_CARE_PROVIDER_SITE_OTHER): Payer: Medicare Other | Admitting: Family Medicine

## 2016-07-23 VITALS — BP 138/64 | HR 67 | Temp 98.6°F | Wt 186.8 lb

## 2016-07-23 DIAGNOSIS — E785 Hyperlipidemia, unspecified: Secondary | ICD-10-CM | POA: Diagnosis not present

## 2016-07-23 DIAGNOSIS — I10 Essential (primary) hypertension: Secondary | ICD-10-CM | POA: Diagnosis not present

## 2016-07-23 DIAGNOSIS — J441 Chronic obstructive pulmonary disease with (acute) exacerbation: Secondary | ICD-10-CM

## 2016-07-23 DIAGNOSIS — N179 Acute kidney failure, unspecified: Secondary | ICD-10-CM

## 2016-07-23 LAB — COMPREHENSIVE METABOLIC PANEL
ALT: 15 U/L (ref 0–53)
AST: 19 U/L (ref 0–37)
Albumin: 4.5 g/dL (ref 3.5–5.2)
Alkaline Phosphatase: 69 U/L (ref 39–117)
BUN: 25 mg/dL — AB (ref 6–23)
CALCIUM: 10.1 mg/dL (ref 8.4–10.5)
CHLORIDE: 104 meq/L (ref 96–112)
CO2: 30 meq/L (ref 19–32)
CREATININE: 1.62 mg/dL — AB (ref 0.40–1.50)
GFR: 44.16 mL/min — ABNORMAL LOW (ref >60.00)
Glucose, Bld: 103 mg/dL — ABNORMAL HIGH (ref 70–99)
Potassium: 4.8 mEq/L (ref 3.5–5.1)
SODIUM: 138 meq/L (ref 135–145)
Total Bilirubin: 0.5 mg/dL (ref 0.2–1.2)
Total Protein: 7 g/dL (ref 6.0–8.3)

## 2016-07-23 MED ORDER — PREDNISONE 20 MG PO TABS: 40.0000 mg | ORAL_TABLET | Freq: Every day | ORAL | 0 refills | Status: DC

## 2016-07-23 MED ORDER — DOXYCYCLINE HYCLATE 100 MG PO TABS: 100.0000 mg | ORAL_TABLET | Freq: Two times a day (BID) | ORAL | 0 refills | Status: DC

## 2016-07-23 NOTE — Progress Notes (Signed)
  Tommi Rumps, MD Phone: [phone]  Lucas Black is a 76 y.o. male who presents today for follow-up.  Hypertension: Not checking at home. He is taking lisinopril. No chest pain, shortness breath, or edema.  Hyperlipidemia: Taking simvastatin. No right upper quadrant pain. No myalgias.  COPD exacerbation: Patient notes over the last week or so he's had increased productive cough of green/yellow mucus. Notes he is blowing some green/yellow mucus out of his nose. He feels more congested in his chest. Some nasal and sinus congestion as well. No fevers. No shortness of breath. Has been wheezing some. He uses Symbicort daily. Has been using albuterol once daily as well.  PMH: Former smoker   ROS see history of present illness  Objective  Physical Exam Vitals:   07/23/16 0756  BP: 138/64  Pulse: 67  Temp: 98.6 F (37 C)    BP Readings from Last 3 Encounters:  07/23/16 138/64  06/22/16 128/72  01/24/16 132/82   Wt Readings from Last 3 Encounters:  07/23/16 186 lb 12.8 oz (84.7 kg)  06/22/16 186 lb 12.8 oz (84.7 kg)  01/24/16 187 lb 6 oz (85 kg)    Physical Exam  Constitutional: No distress.  Cardiovascular: Normal rate, regular rhythm and normal heart sounds.   Pulmonary/Chest: Effort normal.  Minimally prolonged expiratory phase, no wheezes or crackles  Musculoskeletal: He exhibits no edema.  Neurological: He is alert. Gait normal.  Skin: Skin is warm and dry. He is not diaphoretic.     Assessment/Plan: Please see individual problem list.  Hypertension At goal for age. Continue current regimen. Check CMP.  COPD exacerbation (HCC) Currently with COPD exacerbation with increased productive purulent cough. We will cover with doxycycline and prednisone. He'll continue albuterol. If not improving in the next 2 days he'll follow-up. Given return precautions.  Hyperlipidemia Continue simvastatin. Check CMP.   Orders Placed This Encounter  Procedures  . Comp Met  (CMET)    Meds ordered this encounter  Medications  . doxycycline (VIBRA-TABS) 100 MG tablet    Sig: Take 1 tablet (100 mg total) by mouth 2 (two) times daily.    Dispense:  14 tablet    Refill:  0  . predniSONE (DELTASONE) 20 MG tablet    Sig: Take 2 tablets (40 mg total) by mouth daily with breakfast.    Dispense:  10 tablet    Refill:  0    Tommi Rumps, MD [place]

## 2016-07-23 NOTE — Assessment & Plan Note (Signed)
Currently with COPD exacerbation with increased productive purulent cough. We will cover with doxycycline and prednisone. He'll continue albuterol. If not improving in the next 2 days he'll follow-up. Given return precautions.

## 2016-07-23 NOTE — Assessment & Plan Note (Signed)
At goal for age. Continue current regimen. Check CMP.

## 2016-07-23 NOTE — Assessment & Plan Note (Signed)
Continue simvastatin. Check CMP. 

## 2016-07-23 NOTE — Progress Notes (Signed)
Pre-visit discussion using our clinic review tool. No additional management support is needed unless otherwise documented below in the visit note.  

## 2016-07-23 NOTE — Patient Instructions (Signed)
Nice to see you. You likely have COPD exacerbation. We will treat you with prednisone and doxycycline. We will check lab work and contact you with the results. If you develop shortness of breath, cough productive of blood, fevers, or any new or change in symptoms please seek medical attention immediately.

## 2016-07-26 ENCOUNTER — Other Ambulatory Visit (INDEPENDENT_AMBULATORY_CARE_PROVIDER_SITE_OTHER): Payer: Medicare Other

## 2016-07-26 DIAGNOSIS — N179 Acute kidney failure, unspecified: Secondary | ICD-10-CM

## 2016-07-26 LAB — BASIC METABOLIC PANEL
BUN: 26 mg/dL — ABNORMAL HIGH (ref 6–23)
CALCIUM: 10 mg/dL (ref 8.4–10.5)
CO2: 29 mEq/L (ref 19–32)
Chloride: 104 mEq/L (ref 96–112)
Creatinine, Ser: 1.66 mg/dL — ABNORMAL HIGH (ref 0.40–1.50)
GFR: 42.93 mL/min — AB (ref >60.00)
Glucose, Bld: 106 mg/dL — ABNORMAL HIGH (ref 70–99)
Potassium: 4.3 mEq/L (ref 3.5–5.1)
SODIUM: 137 meq/L (ref 135–145)

## 2016-07-27 ENCOUNTER — Other Ambulatory Visit: Payer: Self-pay | Admitting: Family Medicine

## 2016-07-27 DIAGNOSIS — N179 Acute kidney failure, unspecified: Secondary | ICD-10-CM

## 2016-08-31 ENCOUNTER — Other Ambulatory Visit (INDEPENDENT_AMBULATORY_CARE_PROVIDER_SITE_OTHER): Payer: Medicare Other

## 2016-08-31 DIAGNOSIS — N179 Acute kidney failure, unspecified: Secondary | ICD-10-CM

## 2016-08-31 LAB — BASIC METABOLIC PANEL
BUN: 21 mg/dL (ref 6–23)
CHLORIDE: 106 meq/L (ref 96–112)
CO2: 28 meq/L (ref 19–32)
CREATININE: 1.37 mg/dL (ref 0.40–1.50)
Calcium: 10 mg/dL (ref 8.4–10.5)
GFR: 53.57 mL/min — ABNORMAL LOW (ref >60.00)
GLUCOSE: 108 mg/dL — AB (ref 70–99)
Potassium: 4.7 mEq/L (ref 3.5–5.1)
Sodium: 139 mEq/L (ref 135–145)

## 2016-10-03 ENCOUNTER — Encounter: Payer: Self-pay | Admitting: Family Medicine

## 2016-10-03 ENCOUNTER — Ambulatory Visit (INDEPENDENT_AMBULATORY_CARE_PROVIDER_SITE_OTHER): Payer: Medicare Other | Admitting: Family Medicine

## 2016-10-03 DIAGNOSIS — J441 Chronic obstructive pulmonary disease with (acute) exacerbation: Secondary | ICD-10-CM

## 2016-10-03 MED ORDER — DOXYCYCLINE HYCLATE 100 MG PO TABS: 100.0000 mg | ORAL_TABLET | Freq: Two times a day (BID) | ORAL | 0 refills | Status: DC

## 2016-10-03 MED ORDER — PREDNISONE 50 MG PO TABS: ORAL_TABLET | ORAL | 0 refills | Status: DC

## 2016-10-03 NOTE — Assessment & Plan Note (Signed)
Acute COPD exacerbation. Treating with prednisone and doxycycline.

## 2016-10-03 NOTE — Progress Notes (Signed)
Subjective:  Patient ID: Lucas Black, male    DOB: 1941-01-26  Age: 75 y.o. MRN: [id number]  CC: ST, Cough, Congestion, Wheezing  HPI:  76 year old male with COPD presents with the above complaints.  Patient reports that he has been sick since Saturday. He's had sore throat, congestion, cough, wheezing. Cough is productive of yellow sputum. No associated fever. He endorses compliance with his inhalers. No medications or interventions tried. No known exacerbating or relieving factors. No other associated symptoms. No other complaints or concerns at this time.  Social Hx   Social History   Social History  . Marital status: Married    Spouse name: N/A  . Number of children: N/A  . Years of education: N/A   Occupational History  .  Retired   Social History Main Topics  . Smoking status: Former Smoker    Quit date: 03/31/1984  . Smokeless tobacco: Never Used  . Alcohol use Yes     Comment: rarely  . Drug use: No  . Sexual activity: No   Other Topics Concern  . None   Social History Narrative  . None    Review of Systems  Constitutional: Negative for fever.  HENT: Positive for sore throat.   Respiratory: Positive for cough and wheezing.    Objective:  BP 110/68 (BP Location: Left Arm, Patient Position: Sitting, Cuff Size: Normal)   Pulse 77   Temp 98.4 F (36.9 C) (Oral)   Resp 14   Wt 185 lb 4 oz (84 kg)   SpO2 94%   BMI 30.83 kg/m   BP/Weight 10/03/2016 07/23/2016 9/93/7169  Systolic BP 678 938 101  Diastolic BP 68 64 72  Wt. (Lbs) 185.25 186.8 186.8  BMI 30.83 31.09 31.09    Physical Exam  Constitutional: He is oriented to person, place, and time. He appears well-developed. No distress.  HENT:  Mouth/Throat: Oropharynx is clear and moist.  Eyes: Conjunctivae are normal. No scleral icterus.  Neck: Neck supple.  Cardiovascular: Normal rate and regular rhythm.   No murmur heard. Pulmonary/Chest: Effort normal.  Diffuse expiratory wheezing.    Lymphadenopathy:    He has no cervical adenopathy.  Neurological: He is alert and oriented to person, place, and time.  Psychiatric: He has a normal mood and affect.  Vitals reviewed.   Lab Results  Component Value Date   WBC 8.7 12/22/2014   HGB 13.2 12/22/2014   HCT 39.3 12/22/2014   PLT 178.0 12/22/2014   GLUCOSE 108 (H) 08/31/2016   CHOL 156 01/24/2016   TRIG 89.0 01/24/2016   HDL 57.40 01/24/2016   LDLCALC 81 01/24/2016   ALT 15 07/23/2016   AST 19 07/23/2016   NA 139 08/31/2016   K 4.7 08/31/2016   CL 106 08/31/2016   CREATININE 1.37 08/31/2016   BUN 21 08/31/2016   CO2 28 08/31/2016   TSH 1.42 07/17/2010   PSA 0.35 01/24/2016   MICROALBUR <0.7 06/14/2015    Assessment & Plan:   Problem List Items Addressed This Visit      Respiratory   COPD exacerbation ([place])    Acute COPD exacerbation. Treating with prednisone and doxycycline.       Relevant Medications   predniSONE (DELTASONE) 50 MG tablet      Meds ordered this encounter  Medications  . doxycycline (VIBRA-TABS) 100 MG tablet    Sig: Take 1 tablet (100 mg total) by mouth 2 (two) times daily.    Dispense:  14 tablet  Refill:  0  . predniSONE (DELTASONE) 50 MG tablet    Sig: 1 tablet daily x 5 days.    Dispense:  5 tablet    Refill:  0   Follow-up: PRN  [place]

## 2016-10-03 NOTE — Patient Instructions (Signed)
COPD exacerbation.  Take the antibiotic and prednisone as prescribed.  Take care  Dr. Lacinda Axon

## 2016-11-08 DIAGNOSIS — H353131 Nonexudative age-related macular degeneration, bilateral, early dry stage: Secondary | ICD-10-CM | POA: Diagnosis not present

## 2016-11-08 DIAGNOSIS — H26493 Other secondary cataract, bilateral: Secondary | ICD-10-CM | POA: Diagnosis not present

## 2016-11-08 DIAGNOSIS — H538 Other visual disturbances: Secondary | ICD-10-CM | POA: Diagnosis not present

## 2016-11-17 DIAGNOSIS — Z23 Encounter for immunization: Secondary | ICD-10-CM | POA: Diagnosis not present

## 2017-01-29 ENCOUNTER — Other Ambulatory Visit: Payer: Self-pay

## 2017-01-29 ENCOUNTER — Ambulatory Visit (INDEPENDENT_AMBULATORY_CARE_PROVIDER_SITE_OTHER): Payer: Medicare Other | Admitting: Family Medicine

## 2017-01-29 ENCOUNTER — Ambulatory Visit: Payer: Medicare Other | Admitting: Family Medicine

## 2017-01-29 ENCOUNTER — Encounter: Payer: Self-pay | Admitting: Family Medicine

## 2017-01-29 VITALS — BP 140/82 | HR 66 | Temp 97.9°F | Wt 189.9 lb

## 2017-01-29 DIAGNOSIS — I10 Essential (primary) hypertension: Secondary | ICD-10-CM

## 2017-01-29 DIAGNOSIS — Z125 Encounter for screening for malignant neoplasm of prostate: Secondary | ICD-10-CM | POA: Diagnosis not present

## 2017-01-29 DIAGNOSIS — J449 Chronic obstructive pulmonary disease, unspecified: Secondary | ICD-10-CM

## 2017-01-29 DIAGNOSIS — N400 Enlarged prostate without lower urinary tract symptoms: Secondary | ICD-10-CM

## 2017-01-29 DIAGNOSIS — E785 Hyperlipidemia, unspecified: Secondary | ICD-10-CM | POA: Diagnosis not present

## 2017-01-29 MED ORDER — BUDESONIDE-FORMOTEROL FUMARATE 80-4.5 MCG/ACT IN AERO: 2.0000 | INHALATION_SPRAY | Freq: Two times a day (BID) | RESPIRATORY_TRACT | 3 refills | Status: DC

## 2017-01-29 MED ORDER — SIMVASTATIN 20 MG PO TABS: 20.0000 mg | ORAL_TABLET | Freq: Every day | ORAL | 3 refills | Status: DC

## 2017-01-29 MED ORDER — LISINOPRIL 10 MG PO TABS: 5.0000 mg | ORAL_TABLET | Freq: Every day | ORAL | 3 refills | Status: DC

## 2017-01-29 MED ORDER — TERAZOSIN HCL 5 MG PO CAPS: 5.0000 mg | ORAL_CAPSULE | Freq: Every day | ORAL | 3 refills | Status: DC

## 2017-01-29 MED ORDER — FINASTERIDE 5 MG PO TABS: 5.0000 mg | ORAL_TABLET | Freq: Every day | ORAL | 3 refills | Status: DC

## 2017-01-29 NOTE — Assessment & Plan Note (Signed)
Well-controlled for age.  He will start monitoring intermittently at home.  Let us know if consistently greater than 140/90.

## 2017-01-29 NOTE — Assessment & Plan Note (Signed)
He will return for fasting lab work. 

## 2017-01-29 NOTE — Patient Instructions (Signed)
Nice to see you. We will refer you to urology. We will have you return for fasting lab work.

## 2017-01-29 NOTE — Assessment & Plan Note (Signed)
Stable  Continue current regimen  

## 2017-01-29 NOTE — Assessment & Plan Note (Signed)
Suboptimally controlled.  Will refer to urology for further management.  Check PSA today.

## 2017-01-29 NOTE — Progress Notes (Signed)
Tommi Rumps, MD Phone: [phone]  Lucas Black is a 76 y.o. male who presents today for follow-up.  BPH: Currently on terazosin and Proscar.  Does feel he empties his bladder.  He will start and stop.  Some straining.  2-3 times nocturia.  Some dribbling at the end of urination.  Notes this has been stable.  Most recent PSA in the normal range.  Hypertension: Not checking.  Taking lisinopril 5 mg.  No chest pain, shortness of breath, or edema.  COPD: Take Symbicort once daily.  Does not have to use albuterol.  Rare cough.  Minimally productive.  Minimal wheezing.  Notes this is stable.  Social History   Tobacco Use  Smoking Status Former Smoker  . Last attempt to quit: 03/31/1984  . Years since quitting: 32.8  Smokeless Tobacco Never Used     ROS see history of present illness  Objective  Physical Exam Vitals:   01/29/17 1353 01/29/17 1413  BP: (!) 150/80 140/82  Pulse: 66   Temp: 97.9 F (36.6 C)   SpO2: 98%     BP Readings from Last 3 Encounters:  01/29/17 140/82  10/03/16 110/68  07/23/16 138/64   Wt Readings from Last 3 Encounters:  01/29/17 189 lb 14.4 oz (86.1 kg)  10/03/16 185 lb 4 oz (84 kg)  07/23/16 186 lb 12.8 oz (84.7 kg)    Physical Exam  Constitutional: No distress.  Cardiovascular: Normal rate, regular rhythm and normal heart sounds.  Pulmonary/Chest: Effort normal and breath sounds normal.  Musculoskeletal: He exhibits no edema.  Neurological: He is alert. Gait normal.  Skin: Skin is warm and dry. He is not diaphoretic.     Assessment/Plan: Please see individual problem list.  Hypertension Well-controlled for age.  He will start monitoring intermittently at home.  Let us know if consistently greater than 140/90.  COPD (chronic obstructive pulmonary disease) (HCC) Stable.  Continue current regimen.  Hyperlipidemia He will return for fasting lab work.  Prostatic enlargement Suboptimally controlled.  Will refer to urology for  further management.  Check PSA today.   Lucas Black was seen today for follow-up.  Diagnoses and all orders for this visit:  Prostatic enlargement -     PSA, Medicare; Future -     Ambulatory referral to Urology  Essential hypertension -     Comp Met (CMET); Future  Hyperlipidemia, unspecified hyperlipidemia type -     Lipid panel; Future  Screening for prostate cancer -     PSA, Medicare; Future  Chronic obstructive pulmonary disease, unspecified COPD type ([place])  Other orders -     budesonide-formoterol (SYMBICORT) 80-4.5 MCG/ACT inhaler; Inhale 2 puffs into the lungs 2 (two) times daily. -     finasteride (PROSCAR) 5 MG tablet; Take 1 tablet (5 mg total) by mouth daily. -     lisinopril (PRINIVIL,ZESTRIL) 10 MG tablet; Take 0.5 tablets (5 mg total) by mouth daily. -     terazosin (HYTRIN) 5 MG capsule; Take 1 capsule (5 mg total) by mouth at bedtime. -     simvastatin (ZOCOR) 20 MG tablet; Take 1 tablet (20 mg total) by mouth at bedtime.    Orders Placed This Encounter  Procedures  . Comp Met (CMET)    Standing Status:   Future    Standing Expiration Date:   01/29/2018  . PSA, Medicare    Standing Status:   Future    Standing Expiration Date:   01/29/2018  . Lipid panel  Standing Status:   Future    Standing Expiration Date:   01/29/2018  . Ambulatory referral to Urology    Referral Priority:   Routine    Referral Type:   Consultation    Referral Reason:   Specialty Services Required    Requested Specialty:   Urology    Number of Visits Requested:   1    Meds ordered this encounter  Medications  . budesonide-formoterol (SYMBICORT) 80-4.5 MCG/ACT inhaler    Sig: Inhale 2 puffs into the lungs 2 (two) times daily.    Dispense:  3 Inhaler    Refill:  3  . finasteride (PROSCAR) 5 MG tablet    Sig: Take 1 tablet (5 mg total) by mouth daily.    Dispense:  90 tablet    Refill:  3  . lisinopril (PRINIVIL,ZESTRIL) 10 MG tablet    Sig: Take 0.5 tablets (5 mg total) by  mouth daily.    Dispense:  45 tablet    Refill:  3  . terazosin (HYTRIN) 5 MG capsule    Sig: Take 1 capsule (5 mg total) by mouth at bedtime.    Dispense:  90 capsule    Refill:  3  . simvastatin (ZOCOR) 20 MG tablet    Sig: Take 1 tablet (20 mg total) by mouth at bedtime.    Dispense:  90 tablet    Refill:  [place], MD [place]

## 2017-02-08 ENCOUNTER — Other Ambulatory Visit (INDEPENDENT_AMBULATORY_CARE_PROVIDER_SITE_OTHER): Payer: Medicare Other

## 2017-02-08 DIAGNOSIS — Z125 Encounter for screening for malignant neoplasm of prostate: Secondary | ICD-10-CM | POA: Diagnosis not present

## 2017-02-08 DIAGNOSIS — I10 Essential (primary) hypertension: Secondary | ICD-10-CM

## 2017-02-08 DIAGNOSIS — N400 Enlarged prostate without lower urinary tract symptoms: Secondary | ICD-10-CM | POA: Diagnosis not present

## 2017-02-08 DIAGNOSIS — E785 Hyperlipidemia, unspecified: Secondary | ICD-10-CM

## 2017-02-08 LAB — LIPID PANEL
CHOLESTEROL: 135 mg/dL (ref 0–200)
HDL: 49.6 mg/dL (ref >39.00)
LDL Cholesterol: 71 mg/dL (ref 0–99)
NonHDL: 85.06
Total CHOL/HDL Ratio: 3
Triglycerides: 69 mg/dL (ref 0.0–149.0)
VLDL: 13.8 mg/dL (ref 0.0–40.0)

## 2017-02-08 LAB — COMPREHENSIVE METABOLIC PANEL
ALBUMIN: 4.4 g/dL (ref 3.5–5.2)
ALK PHOS: 75 U/L (ref 39–117)
ALT: 12 U/L (ref 0–53)
AST: 17 U/L (ref 0–37)
BILIRUBIN TOTAL: 0.7 mg/dL (ref 0.2–1.2)
BUN: 20 mg/dL (ref 6–23)
CO2: 26 mEq/L (ref 19–32)
CREATININE: 1.32 mg/dL (ref 0.40–1.50)
Calcium: 9.4 mg/dL (ref 8.4–10.5)
Chloride: 107 mEq/L (ref 96–112)
GFR: 55.85 mL/min — ABNORMAL LOW (ref >60.00)
GLUCOSE: 96 mg/dL (ref 70–99)
POTASSIUM: 4.7 meq/L (ref 3.5–5.1)
SODIUM: 139 meq/L (ref 135–145)
TOTAL PROTEIN: 6.3 g/dL (ref 6.0–8.3)

## 2017-02-08 LAB — PSA, MEDICARE: PSA: 0.19 ng/mL (ref 0.10–4.00)

## 2017-02-22 ENCOUNTER — Ambulatory Visit: Payer: Self-pay | Admitting: Urology

## 2017-02-28 ENCOUNTER — Encounter: Payer: Self-pay | Admitting: Urology

## 2017-02-28 ENCOUNTER — Ambulatory Visit (INDEPENDENT_AMBULATORY_CARE_PROVIDER_SITE_OTHER): Payer: Medicare Other | Admitting: Urology

## 2017-02-28 VITALS — BP 152/84 | HR 84 | Ht 66.0 in | Wt 190.0 lb

## 2017-02-28 DIAGNOSIS — R350 Frequency of micturition: Secondary | ICD-10-CM

## 2017-02-28 DIAGNOSIS — N401 Enlarged prostate with lower urinary tract symptoms: Secondary | ICD-10-CM | POA: Diagnosis not present

## 2017-02-28 LAB — URINALYSIS, COMPLETE
Bilirubin, UA: NEGATIVE
GLUCOSE, UA: NEGATIVE
KETONES UA: NEGATIVE
LEUKOCYTES UA: NEGATIVE
NITRITE UA: NEGATIVE
Protein, UA: NEGATIVE
SPEC GRAV UA: 1.01 (ref 1.005–1.030)
Urobilinogen, Ur: 0.2 mg/dL (ref 0.2–1.0)
pH, UA: 7 (ref 5.0–7.5)

## 2017-02-28 LAB — BLADDER SCAN AMB NON-IMAGING

## 2017-02-28 MED ORDER — TAMSULOSIN HCL 0.4 MG PO CAPS: 0.4000 mg | ORAL_CAPSULE | Freq: Every day | ORAL | 1 refills | Status: DC

## 2017-02-28 NOTE — Progress Notes (Signed)
02/28/2017 7:54 PM   [name] 12-Sep-1940 [id number]  Referring provider: Leone Haven, MD [address] [place], [place] [address]  Chief Complaint  Patient presents with  . Benign Prostatic Hypertrophy    New Patient    HPI: Lucas Black is a 76 year old male referred by Dr. Caryl Bis for evaluation of BPH.  He has a long history of BPH with lower urinary tract symptoms.  He states he underwent an endoscopic procedure in 2004 for prostate enlargement although he is not sure of the exact procedure.  There are no records available for review.  He did note significant improvement in his lower urinary tract symptoms.  Several years ago he began to have mild bothersome lower urinary tract symptoms and was started on finasteride and terazosin which he has continued.  He moved to this area approximately 10 years ago and has not seen a urologist since that time.  A PSA performed earlier this month was stable at 0.19.  He denies dysuria or gross hematuria.  He denies flank, abdominal, pelvic or scrotal pain.  His urinary symptoms consist of frequency, urgency, intermittent urinary stream, postvoid dribbling and nocturia x2-3.  IPSS completed today was 14/35   PMH: Past Medical History:  Diagnosis Date  . COPD (chronic obstructive pulmonary disease) ([facility]) 05/30/2011  . COPD exacerbation ([facility]) 07/08/2015  . Gastric ulcer 04/26/2011   1970's   . Hyperlipidemia   . Hypertension   . Prostatic enlargement 11/30/2010  . Screening for colon cancer 2011   colonoscopy normal  . Seasonal allergies 06/24/2014    Surgical History: Past Surgical History:  Procedure Laterality Date  . CATARACT EXTRACTION Left 2015  . GASTRIC RESECTION  1973   secondary to ulcer  . PROSTATE SURGERY      Home Medications:  Allergies as of 02/28/2017   No Known Allergies     Medication List        Accurate as of 02/28/17  7:54 PM. Always use your most recent med list.          albuterol  108 (90 Base) MCG/ACT inhaler Commonly known as:  PROVENTIL HFA;VENTOLIN HFA Inhale 2 puffs into the lungs every 6 (six) hours as needed for wheezing or shortness of breath.   aspirin 81 MG tablet Take 81 mg by mouth daily.   budesonide-formoterol 80-4.5 MCG/ACT inhaler Commonly known as:  SYMBICORT Inhale 2 puffs into the lungs 2 (two) times daily.   finasteride 5 MG tablet Commonly known as:  PROSCAR Take 1 tablet (5 mg total) by mouth daily.   lisinopril 10 MG tablet Commonly known as:  PRINIVIL,ZESTRIL Take 0.5 tablets (5 mg total) by mouth daily.   montelukast 10 MG tablet Commonly known as:  SINGULAIR Take 1 tablet (10 mg total) by mouth at bedtime.   simvastatin 20 MG tablet Commonly known as:  ZOCOR Take 1 tablet (20 mg total) by mouth at bedtime.   tamsulosin 0.4 MG Caps capsule Commonly known as:  FLOMAX Take 1 capsule (0.4 mg total) by mouth daily.       Allergies: No Known Allergies  Family History: Family History  Problem Relation Age of Onset  . Cancer Mother        colon cancer  . Heart disease Father   . Heart disease Brother   . Cancer Brother        skin    Social History:  reports that he quit smoking about 32 years ago. he has  never used smokeless tobacco. He reports that he drinks alcohol. He reports that he does not use drugs.  ROS: UROLOGY Frequent Urination?: Yes Hard to postpone urination?: Yes Burning/pain with urination?: Yes Get up at night to urinate?: Yes Leakage of urine?: Yes Urine stream starts and stops?: Yes Trouble starting stream?: Yes Do you have to strain to urinate?: Yes Blood in urine?: No Urinary tract infection?: No Sexually transmitted disease?: No Injury to kidneys or bladder?: No Painful intercourse?: No Weak stream?: Yes Erection problems?: No Penile pain?: No  Gastrointestinal Nausea?: No Vomiting?: No Indigestion/heartburn?: No Diarrhea?: No Constipation?: Yes  Constitutional Fever: No Night  sweats?: No Weight loss?: No Fatigue?: No  Skin Skin rash/lesions?: No Itching?: No  Eyes Blurred vision?: No Double vision?: No  Ears/Nose/Throat Sore throat?: No Sinus problems?: No  Hematologic/Lymphatic Swollen glands?: No Easy bruising?: No  Cardiovascular Leg swelling?: No Chest pain?: No  Respiratory Cough?: Yes Shortness of breath?: Yes  Endocrine Excessive thirst?: No  Musculoskeletal Back pain?: No Joint pain?: No  Neurological Headaches?: No Dizziness?: No  Psychologic Depression?: No Anxiety?: No  Physical Exam: BP (!) 152/84   Pulse 84   Ht 5\' 6"  (1.676 m)   Wt 190 lb (86.2 kg)   BMI 30.67 kg/m   Constitutional:  Alert and oriented, No acute distress. HEENT: [place] AT, moist mucus membranes.  Trachea midline, no masses. Cardiovascular: No clubbing, cyanosis, or edema. Respiratory: Normal respiratory effort, no increased work of breathing. GI: Abdomen is soft, nontender, nondistended, no abdominal masses GU: No CVA tenderness.  Prostate 45 g, smooth without nodules. Skin: No rashes, bruises or suspicious lesions. Lymph: No cervical or inguinal adenopathy. Neurologic: Grossly intact, no focal deficits, moving all 4 extremities. Psychiatric: Normal mood and affect.  Laboratory Data: Lab Results  Component Value Date   WBC 8.7 12/22/2014   HGB 13.2 12/22/2014   HCT 39.3 12/22/2014   MCV 88.5 12/22/2014   PLT 178.0 12/22/2014    Lab Results  Component Value Date   CREATININE 1.32 02/08/2017     Urinalysis Dipstick trace intact blood/microscopy negative   Assessment & Plan:    1. Benign prostatic hyperplasia with urinary frequency PVR by bladder scan today was 17 mL.  We discussed various options.  If he is satisfied with his voiding pattern there is no intervention or change is necessary.  I also discussed substituting tamsulosin for his terazosin.  He was interested and Rx tamsulosin was sent to his pharmacy.  He will call for  worsening voiding symptoms and will otherwise follow-up annually.  - Urinalysis, Complete - BLADDER SCAN AMB NON-IMAGING   Return in about 1 year (around 02/28/2018) for Recheck.  [name], [facility] [address] [place], [place] [address] [phone]

## 2017-03-22 ENCOUNTER — Other Ambulatory Visit: Payer: Self-pay | Admitting: Radiology

## 2017-03-22 ENCOUNTER — Telehealth: Payer: Self-pay | Admitting: Radiology

## 2017-03-22 DIAGNOSIS — N401 Enlarged prostate with lower urinary tract symptoms: Secondary | ICD-10-CM

## 2017-03-22 DIAGNOSIS — R350 Frequency of micturition: Principal | ICD-10-CM

## 2017-03-22 MED ORDER — TAMSULOSIN HCL 0.4 MG PO CAPS: 0.4000 mg | ORAL_CAPSULE | Freq: Every day | ORAL | 3 refills | Status: DC

## 2017-03-22 NOTE — Telephone Encounter (Signed)
Notified pt of tamsulosin refill sent to pharmacy. Pt voices understanding.

## 2017-06-25 ENCOUNTER — Ambulatory Visit: Payer: Medicare Other

## 2017-07-12 ENCOUNTER — Ambulatory Visit (INDEPENDENT_AMBULATORY_CARE_PROVIDER_SITE_OTHER): Payer: Medicare Other

## 2017-07-12 VITALS — BP 132/70 | HR 75 | Temp 98.1°F | Resp 14 | Ht 66.0 in | Wt 187.8 lb

## 2017-07-12 DIAGNOSIS — Z0000 Encounter for general adult medical examination without abnormal findings: Secondary | ICD-10-CM | POA: Diagnosis not present

## 2017-07-12 NOTE — Patient Instructions (Addendum)
  [name] , Thank you for taking time to come for your Medicare Wellness Visit. I appreciate your ongoing commitment to your health goals. Please review the following plan we discussed and let me know if I can assist you in the future.   Follow up as needed.    Bring a copy of your [place] and/or Living Will to be scanned into chart once completed.  Have a great day!  These are the goals we discussed: Goals    . Healthy Lifestyle     Healthy diet Continue walking for exercise Stay hydrated       This is a list of the screening recommended for you and due dates:  Health Maintenance  Topic Date Due  . Tetanus Vaccine  10/08/1958  . Flu Shot  10/03/2017  . Pneumonia vaccines  Completed

## 2017-07-12 NOTE — Progress Notes (Signed)
Subjective:   Lucas Black is a 77 y.o. male who presents for Medicare Annual/Subsequent preventive examination.  Review of Systems:  No ROS.  Medicare Wellness Visit. Additional risk factors are reflected in the social history.  Cardiac Risk Factors include: advanced age (>65men, >68 women);male gender;hypertension     Objective:    Vitals: BP 132/70 (BP Location: Left Arm, Patient Position: Sitting, Cuff Size: Normal)   Pulse 75   Temp 98.1 F (36.7 C) (Oral)   Resp 14   Ht 5\' 6"  (1.676 m)   Wt 187 lb 12.8 oz (85.2 kg)   SpO2 97%   BMI 30.31 kg/m   Body mass index is 30.31 kg/m.  Advanced Directives 07/12/2017 06/22/2016 06/23/2015  Does Patient Have a Medical Advance Directive? No No No  Type of Advance Directive Living will;Healthcare Power of Attorney - -  Does patient want to make changes to medical advance directive? Yes (MAU/Ambulatory/Procedural Areas - Information given) - -  Would patient like information on creating a medical advance directive? - No - Patient declined Yes - Educational materials given    Tobacco Social History   Tobacco Use  Smoking Status Former Smoker  . Last attempt to quit: 03/31/1984  . Years since quitting: 33.3  Smokeless Tobacco Never Used     Counseling given: Not Answered   Clinical Intake:  Pre-visit preparation completed: Yes  Pain : No/denies pain     Nutritional Status: BMI > 30  Obese Diabetes: No  How often do you need to have someone help you when you read instructions, pamphlets, or other written materials from your doctor or pharmacy?: 1 - Never  Interpreter Needed?: No     Past Medical History:  Diagnosis Date  . COPD (chronic obstructive pulmonary disease) ([facility]) 05/30/2011  . COPD exacerbation ([facility]) 07/08/2015  . Gastric ulcer 04/26/2011   1970's   . Hyperlipidemia   . Hypertension   . Prostatic enlargement 11/30/2010  . Screening for colon cancer 2011   colonoscopy normal  . Seasonal allergies  06/24/2014   Past Surgical History:  Procedure Laterality Date  . CATARACT EXTRACTION Left 2015  . GASTRIC RESECTION  1973   secondary to ulcer  . PROSTATE SURGERY     Family History  Problem Relation Age of Onset  . Cancer Mother        colon cancer  . Heart disease Father   . Heart attack Brother   . Colon cancer Brother   . Cancer Brother        skin   Social History   Socioeconomic History  . Marital status: Married    Spouse name: Not on file  . Number of children: Not on file  . Years of education: Not on file  . Highest education level: Not on file  Occupational History    Employer: retired  Scientific laboratory technician  . Financial resource strain: Not hard at all  . Food insecurity:    Worry: Never true    Inability: Never true  . Transportation needs:    Medical: No    Non-medical: No  Tobacco Use  . Smoking status: Former Smoker    Last attempt to quit: 03/31/1984    Years since quitting: 33.3  . Smokeless tobacco: Never Used  Substance and Sexual Activity  . Alcohol use: Yes    Comment: rarely  . Drug use: No  . Sexual activity: Never  Lifestyle  . Physical activity:    Days per week:  Not on file    Minutes per session: Not on file  . Stress: Not at all  Relationships  . Social connections:    Talks on phone: Not on file    Gets together: Not on file    Attends religious service: Not on file    Active member of club or organization: Not on file    Attends meetings of clubs or organizations: Not on file    Relationship status: Not on file  Other Topics Concern  . Not on file  Social History Narrative  . Not on file    Outpatient Encounter Medications as of 07/12/2017  Medication Sig  . albuterol (PROVENTIL HFA;VENTOLIN HFA) 108 (90 Base) MCG/ACT inhaler Inhale 2 puffs into the lungs every 6 (six) hours as needed for wheezing or shortness of breath.  Marland Kitchen aspirin 81 MG tablet Take 81 mg by mouth daily.  . budesonide-formoterol (SYMBICORT) 80-4.5 MCG/ACT  inhaler Inhale 2 puffs into the lungs 2 (two) times daily.  . finasteride (PROSCAR) 5 MG tablet Take 1 tablet (5 mg total) by mouth daily.  Marland Kitchen lisinopril (PRINIVIL,ZESTRIL) 10 MG tablet Take 0.5 tablets (5 mg total) by mouth daily. (Patient taking differently: Take 10 mg by mouth daily. )  . montelukast (SINGULAIR) 10 MG tablet Take 1 tablet (10 mg total) by mouth at bedtime.  . simvastatin (ZOCOR) 20 MG tablet Take 1 tablet (20 mg total) by mouth at bedtime.  . tamsulosin (FLOMAX) 0.4 MG CAPS capsule Take 1 capsule (0.4 mg total) by mouth daily.   No facility-administered encounter medications on file as of 07/12/2017.     Activities of Daily Living In your present state of health, do you have any difficulty performing the following activities: 07/12/2017  Hearing? Y  Comment Hearing aids  Vision? N  Difficulty concentrating or making decisions? N  Walking or climbing stairs? Y  Comment Dyspnea on exertion more than 1 year.   Dressing or bathing? N  Doing errands, shopping? N  Preparing Food and eating ? N  Using the Toilet? N  In the past six months, have you accidently leaked urine? N  Do you have problems with loss of bowel control? N  Managing your Medications? N  Managing your Finances? Y  Comment Wife assists  Housekeeping or managing your Housekeeping? N  Some recent data might be hidden    Patient Care Team: Leone Haven, MD as PCP - General (Family Medicine)   Assessment:   This is a routine wellness examination for Lucas Black.  The goal of the wellness visit is to assist the patient how to close the gaps in care and create a preventative care plan for the patient.   The roster of all physicians providing medical care to patient is listed in the Snapshot section of the chart.  Osteoporosis risk reviewed.    Safety issues reviewed; Smoke and carbon monoxide detectors in the home. Firearms locked in a safe within the home. Wears seatbelts when driving or riding with  others. No violence in the home.  They do not have excessive sun exposure.  Discussed the need for sun protection: hats, long sleeves and the use of sunscreen if there is significant sun exposure.  Patient is alert, normal appearance, oriented to person/place/and time.  Correctly identified the president of the [place] and recalls of 3/3 words. Performs simple calculations and can read correct time from watch face. Displays appropriate judgement.  No new identified risk were noted.  No failures at  ADL's or IADL's.   BMI- discussed the importance of a healthy diet, water intake and the benefits of aerobic exercise. Educational material provided.   24 hour diet recall: Regular diet  Dental- dentures.   Eye- Visual acuity not assessed per patient preference since they have regular follow up with the ophthalmologist.  Wears corrective lenses.  Sleep patterns- Sleeps through the night without issues.   TDAP vaccine deferred per patient preference.  Follow up with insurance.  Educational material provided.  Dyspnea with walking long distances, climbing a flight of stairs or bending frequently when playing with the grand children.  Symptoms presenting for more than a year.  Denies chest pain, blurred vision,  Dizziness.  Follow up scheduled with PCP.  Family Hx of heart diease and heart attack.   Exercise Activities and Dietary recommendations Current Exercise Habits: The patient does not participate in regular exercise at present, Time (Minutes): 20, Frequency (Times/Week): 4, Weekly Exercise (Minutes/Week): 80, Intensity: Mild  Goals    . Healthy Lifestyle     Healthy diet Continue walking for exercise Stay hydrated       Fall Risk Fall Risk  07/12/2017 06/22/2016 06/23/2015 12/23/2013 12/19/2012  Falls in the past year? No No No No No   Depression Screen PHQ 2/9 Scores 07/12/2017 06/22/2016 06/23/2015 12/23/2013  PHQ - 2 Score 0 0 0 0    Cognitive Function MMSE - Mini Mental State  Exam 06/22/2016 06/23/2015  Orientation to time 5 5  Orientation to Place 5 5  Registration 3 3  Attention/ Calculation 5 5  Recall 3 3  Language- name 2 objects 2 2  Language- repeat 1 1  Language- follow 3 step command 3 3  Language- read & follow direction 1 1  Write a sentence 1 1  Copy design 1 1  Total score 30 30     6CIT Screen 07/12/2017  What Year? 0 points  What month? 0 points  What time? 0 points  Count back from 20 0 points  Months in reverse 0 points  Repeat phrase 0 points  Total Score 0    Immunization History  Administered Date(s) Administered  . Influenza Split 11/30/2010  . Influenza,inj,Quad PF,6+ Mos 12/19/2012, 12/23/2013, 12/15/2014  . Influenza-Unspecified 11/23/2015  . Pneumococcal Conjugate-13 06/23/2013  . Pneumococcal Polysaccharide-23 12/06/2007   Screening Tests Health Maintenance  Topic Date Due  . TETANUS/TDAP  10/08/1958  . INFLUENZA VACCINE  10/03/2017  . PNA vac Low Risk Adult  Completed       Plan:    End of life planning; Advance aging; Advanced directives discussed. Copy of current HCPOA/Living Will discussed.    I have personally reviewed and noted the following in the patient's chart:   . Medical and social history . Use of alcohol, tobacco or illicit drugs  . Current medications and supplements . Functional ability and status . Nutritional status . Physical activity . Advanced directives . List of other physicians . Hospitalizations, surgeries, and ER visits in previous 12 months . Vitals . Screenings to include cognitive, depression, and falls . Referrals and appointments  In addition, I have reviewed and discussed with patient certain preventive protocols, quality metrics, and best practice recommendations. A written personalized care plan for preventive services as well as general preventive health recommendations were provided to patient.     Varney Biles, LPN  08/29/9483

## 2017-07-17 ENCOUNTER — Ambulatory Visit (INDEPENDENT_AMBULATORY_CARE_PROVIDER_SITE_OTHER): Payer: Medicare Other | Admitting: Family Medicine

## 2017-07-17 ENCOUNTER — Encounter: Payer: Self-pay | Admitting: Family Medicine

## 2017-07-17 DIAGNOSIS — H6121 Impacted cerumen, right ear: Secondary | ICD-10-CM

## 2017-07-17 DIAGNOSIS — J449 Chronic obstructive pulmonary disease, unspecified: Secondary | ICD-10-CM

## 2017-07-17 DIAGNOSIS — I10 Essential (primary) hypertension: Secondary | ICD-10-CM | POA: Diagnosis not present

## 2017-07-17 LAB — BASIC METABOLIC PANEL WITH GFR
BUN: 18 mg/dL (ref 6–23)
CO2: 26 meq/L (ref 19–32)
Calcium: 9.8 mg/dL (ref 8.4–10.5)
Chloride: 106 meq/L (ref 96–112)
Creatinine, Ser: 1.34 mg/dL (ref 0.40–1.50)
GFR: 54.83 mL/min — ABNORMAL LOW
Glucose, Bld: 101 mg/dL — ABNORMAL HIGH (ref 70–99)
Potassium: 4.4 meq/L (ref 3.5–5.1)
Sodium: 139 meq/L (ref 135–145)

## 2017-07-17 NOTE — Assessment & Plan Note (Addendum)
Improved control with increased lisinopril dose.  He will monitor at home and if it rises above 140/90 he will let us know.  We will check lab work today.

## 2017-07-17 NOTE — Progress Notes (Signed)
  Tommi Rumps, MD Phone: [phone]  Lucas Black is a 77 y.o. male who presents today for f/u.  HYPERTENSION  Disease Monitoring  Home BP Monitoring blood pressure crept up after stopping his prostate medication.  Got up to the 947S systolically.  He increase his lisinopril to a whole tablet daily about a month ago and it has now been in the 130s.   chest pain-no    dyspnea-no Medications  Compliance-taking lisinopril 10 mg daily.   Edema-no  Patient notes right ear soreness for the last several days.  Notes it felt as though his right tonsil was sore as well though that has gone away.  No ringing in his ears.  No change in his hearing.  No congestion.  COPD: Taking Symbicort once daily.  No coughing.  Notes a little bit of wheezing in the morning before using a Symbicort that that resolves.  No dyspnea.  No albuterol use.    Social History   Tobacco Use  Smoking Status Former Smoker  . Last attempt to quit: 03/31/1984  . Years since quitting: 33.3  Smokeless Tobacco Never Used     ROS see history of present illness  Objective  Physical Exam Vitals:   07/17/17 1018  BP: (!) 144/90  Pulse: 64  Temp: 97.7 F (36.5 C)  SpO2: 97%    BP Readings from Last 3 Encounters:  07/17/17 (!) 144/90  07/12/17 132/70  02/28/17 (!) 152/84   Wt Readings from Last 3 Encounters:  07/17/17 188 lb 3.2 oz (85.4 kg)  07/12/17 187 lb 12.8 oz (85.2 kg)  02/28/17 190 lb (86.2 kg)    Physical Exam  Constitutional: No distress.  HENT:  Left TM normal, right TM obscured by cerumen, no discomfort on pulling on either external ear  Cardiovascular: Normal rate, regular rhythm and normal heart sounds.  Pulmonary/Chest: Effort normal and breath sounds normal.  Musculoskeletal: He exhibits no edema.  Neurological: He is alert.  Skin: Skin is warm and dry. He is not diaphoretic.     Assessment/Plan: Please see individual problem list.  Hypertension Improved control with increased  lisinopril dose.  He will monitor at home and if it rises above 140/90 he will let us know.  We will check lab work today.  COPD (chronic obstructive pulmonary disease) ([facility]) Well-controlled.  Continue current regimen.  Cerumen impaction I suspect this is contributing to his ear symptoms.  This will be irrigated by the CMA.  Irrigation performed revealing normal TM and normal ear canal.  Patient reports some improvement in his symptoms.  He will monitor and if not continuing to improve let us know.   Orders Placed This Encounter  Procedures  . Basic Metabolic Panel (BMET)    No orders of the defined types were placed in this encounter.    Tommi Rumps, MD [place]

## 2017-07-17 NOTE — Patient Instructions (Addendum)
Nice to see you. Please continue to monitor your blood pressure. Please monitor your symptoms and if they do not continue to improve please let us know. We will check lab work today and contact you with results. If you are not able to return in 6 months due to being out of town please contact us and we can try to arrange for lab work at that time wherever you are.

## 2017-07-17 NOTE — Assessment & Plan Note (Addendum)
I suspect this is contributing to his ear symptoms.  This will be irrigated by the CMA.  Irrigation performed revealing normal TM and normal ear canal.  Patient reports some improvement in his symptoms.  He will monitor and if not continuing to improve let us know.

## 2017-07-17 NOTE — Assessment & Plan Note (Signed)
Well-controlled.  Continue current regimen. 

## 2017-07-31 ENCOUNTER — Ambulatory Visit: Payer: Medicare Other | Admitting: Family Medicine

## 2017-11-15 DIAGNOSIS — Z23 Encounter for immunization: Secondary | ICD-10-CM | POA: Diagnosis not present

## 2018-01-13 ENCOUNTER — Other Ambulatory Visit: Payer: Self-pay | Admitting: Family Medicine

## 2018-02-04 ENCOUNTER — Other Ambulatory Visit: Payer: Self-pay | Admitting: Urology

## 2018-02-04 DIAGNOSIS — N401 Enlarged prostate with lower urinary tract symptoms: Secondary | ICD-10-CM

## 2018-02-04 DIAGNOSIS — R350 Frequency of micturition: Principal | ICD-10-CM

## 2018-02-04 MED ORDER — TAMSULOSIN HCL 0.4 MG PO CAPS: 0.4000 mg | ORAL_CAPSULE | Freq: Every day | ORAL | 0 refills | Status: DC

## 2018-02-04 NOTE — Telephone Encounter (Signed)
[facility]  Rx refilled for 30 days until patient has yearly check up.

## 2018-02-28 ENCOUNTER — Ambulatory Visit: Payer: Medicare Other | Admitting: Urology

## 2018-04-05 ENCOUNTER — Other Ambulatory Visit: Payer: Self-pay | Admitting: Family Medicine

## 2018-04-08 ENCOUNTER — Telehealth: Payer: Self-pay | Admitting: Urology

## 2018-04-08 DIAGNOSIS — N401 Enlarged prostate with lower urinary tract symptoms: Secondary | ICD-10-CM

## 2018-04-08 DIAGNOSIS — R350 Frequency of micturition: Principal | ICD-10-CM

## 2018-04-08 MED ORDER — TAMSULOSIN HCL 0.4 MG PO CAPS: 0.4000 mg | ORAL_CAPSULE | Freq: Every day | ORAL | 0 refills | Status: DC

## 2018-04-08 NOTE — Telephone Encounter (Signed)
1 refill of Tamsulosin sent to [facility] with note pt needs appt.

## 2018-04-08 NOTE — Telephone Encounter (Signed)
Pt needs refill for Tamsulosin. Send to [facility] please.

## 2018-05-14 ENCOUNTER — Other Ambulatory Visit: Payer: Self-pay

## 2018-05-14 ENCOUNTER — Encounter: Payer: Self-pay | Admitting: Urology

## 2018-05-14 ENCOUNTER — Ambulatory Visit (INDEPENDENT_AMBULATORY_CARE_PROVIDER_SITE_OTHER): Payer: Medicare Other | Admitting: Urology

## 2018-05-14 DIAGNOSIS — R350 Frequency of micturition: Secondary | ICD-10-CM | POA: Diagnosis not present

## 2018-05-14 DIAGNOSIS — N401 Enlarged prostate with lower urinary tract symptoms: Secondary | ICD-10-CM | POA: Diagnosis not present

## 2018-05-14 MED ORDER — TAMSULOSIN HCL 0.4 MG PO CAPS: 0.4000 mg | ORAL_CAPSULE | Freq: Every day | ORAL | 3 refills | Status: DC

## 2018-05-14 NOTE — Progress Notes (Signed)
05/14/2018 4:41 PM   Lucas Black [birth year] [id number]  Referring provider: Leone Haven, MD [address] [place], [place] [address]  Chief Complaint  Patient presents with  . Benign Prostatic Hypertrophy   Urologic history: 1.  BPH with lower urinary tract symptoms  -Outlet procedure 2004 for BPH; records not available  -Recurrent symptoms around 2014 and started on combination therapy  -Initially seen here 02/2017   HPI: 78 year old male presents for follow-up.  At his initial visit 02/2017 he had moderate lower urinary tract symptoms with most bothersome symptoms being frequency, urgency, intermittent urinary stream, postvoid dribbling and nocturia x2-3.  IPSS was 14/4.  Repeat IPSS today was 7/0.  He was on a low-dose of terazosin which was discontinued and he was started on tamsulosin.  He did note significant improvement in his lower urinary tract symptoms on tamsulosin.  He remains on finasteride.  Denies dysuria, gross hematuria or flank/abdominal/pelvic/scrotal pain.   PMH: Past Medical History:  Diagnosis Date  . COPD (chronic obstructive pulmonary disease) ([facility]) 05/30/2011  . COPD exacerbation ([facility]) 07/08/2015  . Gastric ulcer 04/26/2011   1970's   . Hyperlipidemia   . Hypertension   . Prostatic enlargement 11/30/2010  . Screening for colon cancer 2011   colonoscopy normal  . Seasonal allergies 06/24/2014    Surgical History: Past Surgical History:  Procedure Laterality Date  . CATARACT EXTRACTION Left 2015  . GASTRIC RESECTION  1973   secondary to ulcer  . PROSTATE SURGERY      Home Medications:  Allergies as of 05/14/2018   No Known Allergies     Medication List       Accurate as of May 14, 2018  4:41 PM. Always use your most recent med list.        albuterol 108 (90 Base) MCG/ACT inhaler Commonly known as:  PROVENTIL HFA;VENTOLIN HFA Inhale 2 puffs into the lungs every 6 (six) hours as needed for wheezing or shortness of  breath.   aspirin 81 MG tablet Take 81 mg by mouth daily.   finasteride 5 MG tablet Commonly known as:  PROSCAR TAKE 1 TABLET DAILY   lisinopril 10 MG tablet Commonly known as:  PRINIVIL,ZESTRIL TAKE ONE-HALF (1/2) TABLET DAILY   montelukast 10 MG tablet Commonly known as:  SINGULAIR Take 1 tablet (10 mg total) by mouth at bedtime.   simvastatin 20 MG tablet Commonly known as:  ZOCOR TAKE 1 TABLET AT BEDTIME   Symbicort 80-4.5 MCG/ACT inhaler Generic drug:  budesonide-formoterol USE 2 INHALATIONS TWICE A DAY   tamsulosin 0.4 MG Caps capsule Commonly known as:  FLOMAX Take 1 capsule (0.4 mg total) by mouth daily.       Allergies: No Known Allergies  Family History: Family History  Problem Relation Age of Onset  . Cancer Mother        colon cancer  . Heart disease Father   . Heart attack Brother   . Colon cancer Brother   . Cancer Brother        skin    Social History:  reports that he quit smoking about 34 years ago. He has never used smokeless tobacco. He reports current alcohol use. He reports that he does not use drugs.  ROS: UROLOGY Frequent Urination?: No Hard to postpone urination?: No Burning/pain with urination?: No Get up at night to urinate?: Yes Leakage of urine?: No Urine stream starts and stops?: No Trouble starting stream?: No Do you have to strain to  urinate?: No Blood in urine?: No Urinary tract infection?: No Sexually transmitted disease?: No Injury to kidneys or bladder?: No Painful intercourse?: No Weak stream?: No Erection problems?: No Penile pain?: No  Gastrointestinal Nausea?: No Vomiting?: No Indigestion/heartburn?: No Diarrhea?: No Constipation?: No  Constitutional Fever: No Night sweats?: No Weight loss?: No Fatigue?: No  Skin Skin rash/lesions?: No Itching?: No  Eyes Blurred vision?: No Double vision?: No  Ears/Nose/Throat Sore throat?: No Sinus problems?: No  Hematologic/Lymphatic Swollen glands?:  No Easy bruising?: No  Cardiovascular Leg swelling?: No Chest pain?: No  Respiratory Cough?: No Shortness of breath?: No  Endocrine Excessive thirst?: No  Musculoskeletal Back pain?: No Joint pain?: No  Neurological Headaches?: No Dizziness?: No  Psychologic Depression?: No Anxiety?: No  Physical Exam: BP (!) 158/70 (BP Location: Left Arm, Patient Position: Sitting, Cuff Size: Normal)   Pulse 60   Ht 5\' 6"  (1.676 m)   Wt 188 lb (85.3 kg)   BMI 30.34 kg/m   Constitutional:  Alert and oriented, No acute distress. HEENT: [place] AT, moist mucus membranes.  Trachea midline, no masses. Cardiovascular: No clubbing, cyanosis, or edema. Respiratory: Normal respiratory effort, no increased work of breathing. GI: Abdomen is soft, nontender, nondistended, no abdominal masses GU: No CVA tenderness Lymph: No cervical or inguinal lymphadenopathy. Skin: No rashes, bruises or suspicious lesions. Neurologic: Grossly intact, no focal deficits, moving all 4 extremities. Psychiatric: Normal mood and affect.  Laboratory Data:  Lab Results  Component Value Date   PSA 0.19 02/08/2017   PSA 0.35 01/24/2016   PSA 0.26 12/22/2014    Assessment & Plan:   78 year old male with BPH and moderate lower urinary tract symptoms doing well on combination therapy with finasteride/tamsulosin.  These medications were refilled and he will continue annual follow-up.   [name], [place] [address] [place], [place] [address] [phone]

## 2018-05-14 NOTE — Patient Instructions (Signed)

## 2018-05-15 ENCOUNTER — Encounter: Payer: Self-pay | Admitting: Urology

## 2018-05-15 MED ORDER — TAMSULOSIN HCL 0.4 MG PO CAPS: 0.4000 mg | ORAL_CAPSULE | Freq: Every day | ORAL | 3 refills | Status: DC

## 2018-05-16 MED ORDER — TAMSULOSIN HCL 0.4 MG PO CAPS: 0.4000 mg | ORAL_CAPSULE | Freq: Every day | ORAL | 3 refills | Status: DC

## 2018-05-16 NOTE — Addendum Note (Signed)
Addended by: Donalee Citrin on: 05/16/2018 02:37 PM   Modules accepted: Orders

## 2018-07-16 ENCOUNTER — Other Ambulatory Visit: Payer: Self-pay

## 2018-07-16 ENCOUNTER — Ambulatory Visit (INDEPENDENT_AMBULATORY_CARE_PROVIDER_SITE_OTHER): Payer: Medicare Other | Admitting: Family Medicine

## 2018-07-16 ENCOUNTER — Encounter: Payer: Self-pay | Admitting: Family Medicine

## 2018-07-16 ENCOUNTER — Ambulatory Visit (INDEPENDENT_AMBULATORY_CARE_PROVIDER_SITE_OTHER): Payer: Medicare Other

## 2018-07-16 ENCOUNTER — Telehealth: Payer: Self-pay | Admitting: Family Medicine

## 2018-07-16 DIAGNOSIS — E785 Hyperlipidemia, unspecified: Secondary | ICD-10-CM

## 2018-07-16 DIAGNOSIS — I10 Essential (primary) hypertension: Secondary | ICD-10-CM | POA: Diagnosis not present

## 2018-07-16 DIAGNOSIS — R350 Frequency of micturition: Secondary | ICD-10-CM | POA: Diagnosis not present

## 2018-07-16 DIAGNOSIS — N401 Enlarged prostate with lower urinary tract symptoms: Secondary | ICD-10-CM

## 2018-07-16 DIAGNOSIS — R7303 Prediabetes: Secondary | ICD-10-CM

## 2018-07-16 DIAGNOSIS — J449 Chronic obstructive pulmonary disease, unspecified: Secondary | ICD-10-CM

## 2018-07-16 DIAGNOSIS — Z0000 Encounter for general adult medical examination without abnormal findings: Secondary | ICD-10-CM | POA: Diagnosis not present

## 2018-07-16 NOTE — Assessment & Plan Note (Signed)
Symptoms adequately controlled with current medication.  He will continue Proscar and Flomax.

## 2018-07-16 NOTE — Progress Notes (Signed)
Subjective:   Lucas Black is a 78 y.o. male who presents for Medicare Annual/Subsequent preventive examination.  Review of Systems:  No ROS.  Medicare Wellness Virtual Visit.  Visual/audio telehealth visit, UTA vital signs.   See social history for additional risk factors.   Cardiac Risk Factors include: advanced age (>55men, >77 women);hypertension;male gender     Objective:    Vitals: There were no vitals taken for this visit.  There is no height or weight on file to calculate BMI.  Advanced Directives 07/16/2018 07/12/2017 06/22/2016 06/23/2015  Does Patient Have a Medical Advance Directive? Yes No No No  Type of Paramedic of [place];Living will Living will;Healthcare Power of Attorney - -  Does patient want to make changes to medical advance directive? No - Patient declined Yes (MAU/Ambulatory/Procedural Areas - Information given) - -  Copy of [place] in Chart? Yes - validated most recent copy scanned in chart (See row information) - - -  Would patient like information on creating a medical advance directive? - - No - Patient declined Yes - Educational materials given    Tobacco Social History   Tobacco Use  Smoking Status Former Smoker  . Last attempt to quit: 03/31/1984  . Years since quitting: 34.3  Smokeless Tobacco Never Used     Counseling given: Not Answered   Clinical Intake:  Pre-visit preparation completed: Yes        Diabetes: No  How often do you need to have someone help you when you read instructions, pamphlets, or other written materials from your doctor or pharmacy?: 1 - Never  Interpreter Needed?: No     Past Medical History:  Diagnosis Date  . COPD (chronic obstructive pulmonary disease) ([facility]) 05/30/2011  . COPD exacerbation ([facility]) 07/08/2015  . Gastric ulcer 04/26/2011   1970's   . Hyperlipidemia   . Hypertension   . Prostatic enlargement 11/30/2010  . Screening for colon cancer 2011   colonoscopy normal  . Seasonal allergies 06/24/2014   Past Surgical History:  Procedure Laterality Date  . CATARACT EXTRACTION Left 2015  . GASTRIC RESECTION  1973   secondary to ulcer  . PROSTATE SURGERY     Family History  Problem Relation Age of Onset  . Cancer Mother        colon cancer  . Heart disease Father   . Heart attack Brother   . Colon cancer Brother   . Cancer Brother        skin   Social History   Socioeconomic History  . Marital status: Married    Spouse name: Not on file  . Number of children: Not on file  . Years of education: Not on file  . Highest education level: Not on file  Occupational History    Employer: retired  Scientific laboratory technician  . Financial resource strain: Not hard at all  . Food insecurity:    Worry: Never true    Inability: Never true  . Transportation needs:    Medical: No    Non-medical: No  Tobacco Use  . Smoking status: Former Smoker    Last attempt to quit: 03/31/1984    Years since quitting: 34.3  . Smokeless tobacco: Never Used  Substance and Sexual Activity  . Alcohol use: Yes    Comment: rarely  . Drug use: No  . Sexual activity: Yes  Lifestyle  . Physical activity:    Days per week: Not on file  Minutes per session: Not on file  . Stress: Not at all  Relationships  . Social connections:    Talks on phone: Not on file    Gets together: Not on file    Attends religious service: Not on file    Active member of club or organization: Not on file    Attends meetings of clubs or organizations: Not on file    Relationship status: Not on file  Other Topics Concern  . Not on file  Social History Narrative  . Not on file    Outpatient Encounter Medications as of 07/16/2018  Medication Sig  . albuterol (PROVENTIL HFA;VENTOLIN HFA) 108 (90 Base) MCG/ACT inhaler Inhale 2 puffs into the lungs every 6 (six) hours as needed for wheezing or shortness of breath.  [place] Kitchen aspirin 81 MG tablet Take 81 mg by mouth daily.  . finasteride  (PROSCAR) 5 MG tablet TAKE 1 TABLET DAILY  . lisinopril (PRINIVIL,ZESTRIL) 10 MG tablet TAKE ONE-HALF (1/2) TABLET DAILY  . montelukast (SINGULAIR) 10 MG tablet Take 1 tablet (10 mg total) by mouth at bedtime.  . simvastatin (ZOCOR) 20 MG tablet TAKE 1 TABLET AT BEDTIME  . SYMBICORT 80-4.5 MCG/ACT inhaler USE 2 INHALATIONS TWICE A DAY  . tamsulosin (FLOMAX) 0.4 MG CAPS capsule Take 1 capsule (0.4 mg total) by mouth daily.   No facility-administered encounter medications on file as of 07/16/2018.     Activities of Daily Living In your present state of health, do you have any difficulty performing the following activities: 07/16/2018  Hearing? Y  Comment Difficulty hearing conversational tones in crowds. He chooses not to wear hearing aids.   Vision? N  Difficulty concentrating or making decisions? N  Walking or climbing stairs? N  Dressing or bathing? N  Doing errands, shopping? N  Preparing Food and eating ? N  Using the Toilet? N  In the past six months, have you accidently leaked urine? N  Do you have problems with loss of bowel control? N  Managing your Medications? N  Managing your Finances? N  Housekeeping or managing your Housekeeping? N  Some recent data might be hidden    Patient Care Team: Leone Haven, MD as PCP - General (Family Medicine)   Assessment:   This is a routine wellness examination for Lucas Black.  I connected with patient 07/16/18 at  3:00 PM EDT by a video/audio enabled telemedicine application and verified that I am speaking with the correct person using two identifiers. Patient stated full name and DOB. Patient gave permission to continue with virtual visit. Patient's location was at home and Nurse's location was at [place] office.   Health Screenings  Colonoscopy - 12/2010 Glaucoma -none Hearing -demonstrates normal hearing during visit. PSA -02/2017 Labs followed by pcp. Dental- dentures Vision- visits within the last 12 months.  Social   Alcohol intake - yes, rare      Smoking history- former   Smokers in home? none Illicit drug use? none Exercise - walking Diet - regular Sexually Active - yes BMI- discussed the importance of a healthy diet, water intake and the benefits of aerobic exercise.  Educational material provided.   Safety  Patient feels safe at home- yes Patient does have smoke detectors at home- yes Patient does wear sunscreen or protective clothing when in direct sunlight -yes Patient does wear seat belt when in a moving vehicle -yes  Covid-19 precautions and sickness symptoms discussed.   Activities of Daily Living Patient denies needing assistance with:  driving, household chores, feeding themselves, getting from bed to chair, getting to the toilet, bathing/showering, dressing, managing money, or preparing meals.  No new identified risk were noted.    Depression Screen Patient denies losing interest in daily life, feeling hopeless, or crying easily over simple problems.   Medication-taking as directed and without issues.   Fall Screen Patient denies being afraid of falling or falling in the last year.   Memory Screen Patient is alert.  Patient denies difficulty focusing, concentrating or misplacing items. Correctly identified the president of the [place] , season and recall. Patient likes to play solitaire for brain stimulation.  Immunizations The following Immunizations were discussed: Influenza, shingles, pneumonia, and tetanus.   Other Providers Patient Care Team: Leone Haven, MD as PCP - General (Family Medicine)  Exercise Activities and Dietary recommendations Current Exercise Habits: Home exercise routine, Type of exercise: walking, Intensity: Mild  Goals      Patient Stated   . Maintain Healthy Lifestyle (pt-stated)     Keep walking for exercise Healthy diet Drink plenty of fluids       Fall Risk Fall Risk  07/16/2018 07/12/2017 06/22/2016 06/23/2015 12/23/2013  Falls in  the past year? 0 No No No No   Depression Screen PHQ 2/9 Scores 07/16/2018 07/12/2017 06/22/2016 06/23/2015  PHQ - 2 Score 0 0 0 0    Cognitive Function MMSE - Mini Mental State Exam 06/22/2016 06/23/2015  Orientation to time 5 5  Orientation to Place 5 5  Registration 3 3  Attention/ Calculation 5 5  Recall 3 3  Language- name 2 objects 2 2  Language- repeat 1 1  Language- follow 3 step command 3 3  Language- read & follow direction 1 1  Write a sentence 1 1  Copy design 1 1  Total score 30 30     6CIT Screen 07/16/2018 07/12/2017  What Year? 0 points 0 points  What month? 0 points 0 points  What time? 0 points 0 points  Count back from 20 0 points 0 points  Months in reverse 0 points 0 points  Repeat phrase 0 points 0 points  Total Score 0 0    Immunization History  Administered Date(s) Administered  . Influenza Split 11/30/2010  . Influenza, High Dose Seasonal PF 11/17/2016  . Influenza,inj,Quad PF,6+ Mos 12/19/2012, 12/23/2013, 12/15/2014  . Influenza-Unspecified 11/23/2015  . Pneumococcal Conjugate-13 06/23/2013  . Pneumococcal Polysaccharide-23 12/06/2007   Screening Tests Health Maintenance  Topic Date Due  . TETANUS/TDAP  10/08/1958  . INFLUENZA VACCINE  10/04/2018  . PNA vac Low Risk Adult  Completed       Plan:    End of life planning; Advance aging; Advanced directives discussed.  Copy of current HCPOA/Living Will on file.    I have personally reviewed and noted the following in the patient's chart:   . Medical and social history . Use of alcohol, tobacco or illicit drugs  . Current medications and supplements . Functional ability and status . Nutritional status . Physical activity . Advanced directives . List of other physicians . Hospitalizations, surgeries, and ER visits in previous 12 months . Vitals . Screenings to include cognitive, depression, and falls . Referrals and appointments  In addition, I have reviewed and discussed with patient  certain preventive protocols, quality metrics, and best practice recommendations. A written personalized care plan for preventive services as well as general preventive health recommendations were provided to patient.     OBrien-Blaney, Tallen Schnorr L, LPN  07/16/2018   

## 2018-07-16 NOTE — Patient Instructions (Addendum)
  [name] , Thank you for taking time to come for your Medicare Wellness Visit. I appreciate your ongoing commitment to your health goals. Please review the following plan we discussed and let me know if I can assist you in the future.   These are the goals we discussed: Goals      Patient Stated   . Maintain Healthy Lifestyle (pt-stated)     Keep walking for exercise Healthy diet Drink plenty of fluids       This is a list of the screening recommended for you and due dates:  Health Maintenance  Topic Date Due  . Tetanus Vaccine  10/08/1958  . Flu Shot  10/04/2018  . Pneumonia vaccines  Completed

## 2018-07-16 NOTE — Assessment & Plan Note (Signed)
Systolic blood pressure is adequately controlled for age.  He will continue his current medication.  He will come in for lab work tomorrow.

## 2018-07-16 NOTE — Telephone Encounter (Signed)
Please contact the patient and get him set up for follow-up in 6 months.  Thanks.

## 2018-07-16 NOTE — Progress Notes (Signed)
Virtual Visit via telephone Note  This visit type was conducted due to national recommendations for restrictions regarding the COVID-19 pandemic (e.g. social distancing).  This format is felt to be most appropriate for this patient at this time.  All issues noted in this document were discussed and addressed.  No physical exam was performed (except for noted visual exam findings with Video Visits).   I connected with today at  2:15 PM EDT by a video enabled telemedicine application or telephone and verified that I am speaking with the correct person using two identifiers. Location patient: son's house Location provider: work or home office Persons participating in the virtual visit: patient, provider, [place]  I discussed the limitations, risks, security and privacy concerns of performing an evaluation and management service by telephone and the availability of in person appointments. I also discussed with the patient that there may be a patient responsible charge related to this service. The patient expressed understanding and agreed to proceed.  Interactive audio and video telecommunications were attempted between this provider and patient, however failed, due to patient having technical difficulties OR patient did not have access to video capability.  We continued and completed visit with audio only.   Reason for visit: f/u.  HPI: HYPERTENSION  Disease Monitoring  Home BP Monitoring 300 systolically Chest pain- no    Dyspnea- no Medications  Compliance-  Taking lisinopril.  Edema- no  COPD: Medication compliance- taking symbicort   Rescue inhaler use- 1-2x/day with pollen, though has improved recently with decrease in pollen with minimal use Dyspnea- no  Wheezing- rare  Cough- no  Productive- no  BPH: Strain- no Flow- relatively good Frequency- no Urgency- no Nocturia- 1-2x Emptying bladder- yes Medication- proscar, flomax      ROS: See pertinent positives and  negatives per HPI.  Past Medical History:  Diagnosis Date  . COPD (chronic obstructive pulmonary disease) ([facility]) 05/30/2011  . COPD exacerbation ([facility]) 07/08/2015  . Gastric ulcer 04/26/2011   1970's   . Hyperlipidemia   . Hypertension   . Prostatic enlargement 11/30/2010  . Screening for colon cancer 2011   colonoscopy normal  . Seasonal allergies 06/24/2014    Past Surgical History:  Procedure Laterality Date  . CATARACT EXTRACTION Left 2015  . GASTRIC RESECTION  1973   secondary to ulcer  . PROSTATE SURGERY      Family History  Problem Relation Age of Onset  . Cancer Mother        colon cancer  . Heart disease Father   . Heart attack Brother   . Colon cancer Brother   . Cancer Brother        skin    SOCIAL HX: Former smoker.   Current Outpatient Medications:  .  albuterol (PROVENTIL HFA;VENTOLIN HFA) 108 (90 Base) MCG/ACT inhaler, Inhale 2 puffs into the lungs every 6 (six) hours as needed for wheezing or shortness of breath., Disp: 3 Inhaler, Rfl: 1 .  aspirin 81 MG tablet, Take 81 mg by mouth daily., Disp: , Rfl:  .  finasteride (PROSCAR) 5 MG tablet, TAKE 1 TABLET DAILY, Disp: 90 tablet, Rfl: 4 .  lisinopril (PRINIVIL,ZESTRIL) 10 MG tablet, TAKE ONE-HALF (1/2) TABLET DAILY, Disp: 45 tablet, Rfl: 4 .  montelukast (SINGULAIR) 10 MG tablet, Take 1 tablet (10 mg total) by mouth at bedtime., Disp: 90 tablet, Rfl: 3 .  simvastatin (ZOCOR) 20 MG tablet, TAKE 1 TABLET AT BEDTIME, Disp: 90 tablet, Rfl: 4 .  SYMBICORT 80-4.5 MCG/ACT  inhaler, USE 2 INHALATIONS TWICE A DAY, Disp: 30.6 g, Rfl: 4 .  tamsulosin (FLOMAX) 0.4 MG CAPS capsule, Take 1 capsule (0.4 mg total) by mouth daily., Disp: 90 capsule, Rfl: 3  EXAM: This was a telehealth telephone visit and thus no physical exam was completed.  ASSESSMENT AND PLAN:  Discussed the following assessment and plan:  Essential hypertension - Plan: Comp Met (CMET)  Chronic obstructive pulmonary disease, unspecified COPD type ([facility])   Hyperlipidemia, unspecified hyperlipidemia type - Plan: Lipid panel  Prediabetes - Plan: Hemoglobin A1c  Benign prostatic hyperplasia with urinary frequency  Hypertension Systolic blood pressure is adequately controlled for age.  He will continue his current medication.  He will come in for lab work tomorrow.  COPD (chronic obstructive pulmonary disease) ([facility]) Improved recently.  He will continue Symbicort and as needed albuterol.  Benign prostatic hyperplasia with urinary frequency Symptoms adequately controlled with current medication.  He will continue Proscar and Flomax.  CMA will contact patient to get him scheduled for follow-up in 6 months.  Social distancing precautions and sick precautions given regarding COVID-19.   I discussed the assessment and treatment plan with the patient. The patient was provided an opportunity to ask questions and all were answered. The patient agreed with the plan and demonstrated an understanding of the instructions.   The patient was advised to call back or seek an in-person evaluation if the symptoms worsen or if the condition fails to improve as anticipated.  I provided 18 minutes of non-face-to-face time during this encounter.   Tommi Rumps, MD

## 2018-07-16 NOTE — Assessment & Plan Note (Signed)
Improved recently.  He will continue Symbicort and as needed albuterol.

## 2018-07-17 ENCOUNTER — Other Ambulatory Visit (INDEPENDENT_AMBULATORY_CARE_PROVIDER_SITE_OTHER): Payer: Medicare Other

## 2018-07-17 DIAGNOSIS — E785 Hyperlipidemia, unspecified: Secondary | ICD-10-CM | POA: Diagnosis not present

## 2018-07-17 DIAGNOSIS — R7303 Prediabetes: Secondary | ICD-10-CM

## 2018-07-17 DIAGNOSIS — I10 Essential (primary) hypertension: Secondary | ICD-10-CM | POA: Diagnosis not present

## 2018-07-17 LAB — COMPREHENSIVE METABOLIC PANEL
ALT: 14 U/L (ref 0–53)
AST: 25 U/L (ref 0–37)
Albumin: 4.5 g/dL (ref 3.5–5.2)
Alkaline Phosphatase: 75 U/L (ref 39–117)
BUN: 24 mg/dL — ABNORMAL HIGH (ref 6–23)
CO2: 28 mEq/L (ref 19–32)
Calcium: 9.8 mg/dL (ref 8.4–10.5)
Chloride: 104 mEq/L (ref 96–112)
Creatinine, Ser: 1.55 mg/dL — ABNORMAL HIGH (ref 0.40–1.50)
GFR: 43.49 mL/min — ABNORMAL LOW (ref >60.00)
Glucose, Bld: 91 mg/dL (ref 70–99)
Potassium: 4.5 mEq/L (ref 3.5–5.1)
Sodium: 139 mEq/L (ref 135–145)
Total Bilirubin: 0.9 mg/dL (ref 0.2–1.2)
Total Protein: 6.7 g/dL (ref 6.0–8.3)

## 2018-07-17 LAB — LIPID PANEL
Cholesterol: 141 mg/dL (ref 0–200)
HDL: 49 mg/dL (ref >39.00)
LDL Cholesterol: 74 mg/dL (ref 0–99)
NonHDL: 92.06
Total CHOL/HDL Ratio: 3
Triglycerides: 90 mg/dL (ref 0.0–149.0)
VLDL: 18 mg/dL (ref 0.0–40.0)

## 2018-07-17 LAB — HEMOGLOBIN A1C: Hgb A1c MFr Bld: 5.5 % (ref 4.6–6.5)

## 2018-07-17 NOTE — Telephone Encounter (Signed)
Left message for patient to return call back. [facility] may give and obtain information.  

## 2018-07-17 NOTE — Progress Notes (Signed)
I have reviewed the above note and agree.  Tarique Loveall, M.D.  

## 2018-07-22 ENCOUNTER — Other Ambulatory Visit: Payer: Self-pay | Admitting: Family Medicine

## 2018-07-22 DIAGNOSIS — N183 Chronic kidney disease, stage 3 unspecified: Secondary | ICD-10-CM

## 2018-07-29 ENCOUNTER — Telehealth: Payer: Self-pay | Admitting: Internal Medicine

## 2018-07-29 NOTE — Telephone Encounter (Signed)
See phone note

## 2018-07-29 NOTE — Telephone Encounter (Signed)
On call with pts wife and pt wants to change lab appt 08/21/2018 9:30 to 6/11 at 9:15   [place]

## 2018-08-14 ENCOUNTER — Other Ambulatory Visit: Payer: Self-pay

## 2018-08-14 ENCOUNTER — Other Ambulatory Visit (INDEPENDENT_AMBULATORY_CARE_PROVIDER_SITE_OTHER): Payer: Medicare Other

## 2018-08-14 DIAGNOSIS — N183 Chronic kidney disease, stage 3 unspecified: Secondary | ICD-10-CM

## 2018-08-14 LAB — BASIC METABOLIC PANEL
BUN: 25 mg/dL — ABNORMAL HIGH (ref 6–23)
CO2: 28 mEq/L (ref 19–32)
Calcium: 10.3 mg/dL (ref 8.4–10.5)
Chloride: 106 mEq/L (ref 96–112)
Creatinine, Ser: 1.44 mg/dL (ref 0.40–1.50)
GFR: 47.34 mL/min — ABNORMAL LOW (ref >60.00)
Glucose, Bld: 102 mg/dL — ABNORMAL HIGH (ref 70–99)
Potassium: 4.6 mEq/L (ref 3.5–5.1)
Sodium: 140 mEq/L (ref 135–145)

## 2018-08-21 ENCOUNTER — Other Ambulatory Visit: Payer: Medicare Other

## 2019-03-12 ENCOUNTER — Other Ambulatory Visit: Payer: Self-pay | Admitting: Family Medicine

## 2019-03-13 ENCOUNTER — Encounter: Payer: Self-pay | Admitting: Emergency Medicine

## 2019-03-13 ENCOUNTER — Ambulatory Visit
Admission: EM | Admit: 2019-03-13 | Discharge: 2019-03-13 | Disposition: A | Discharge status: Home / Self Care | Payer: Medicare Other | Attending: Emergency Medicine | Admitting: Emergency Medicine

## 2019-03-13 ENCOUNTER — Other Ambulatory Visit: Payer: Self-pay

## 2019-03-13 DIAGNOSIS — J441 Chronic obstructive pulmonary disease with (acute) exacerbation: Secondary | ICD-10-CM

## 2019-03-13 DIAGNOSIS — Z87891 Personal history of nicotine dependence: Secondary | ICD-10-CM | POA: Diagnosis not present

## 2019-03-13 DIAGNOSIS — R05 Cough: Secondary | ICD-10-CM | POA: Diagnosis not present

## 2019-03-13 LAB — POC SARS CORONAVIRUS 2 AG -  ED: SARS Coronavirus 2 Ag: NEGATIVE

## 2019-03-13 MED ORDER — AZITHROMYCIN 250 MG PO TABS: 250.0000 mg | ORAL_TABLET | Freq: Every day | ORAL | 0 refills | Status: DC

## 2019-03-13 MED ORDER — PREDNISONE 10 MG (21) PO TBPK: ORAL_TABLET | Freq: Every day | ORAL | 0 refills | Status: DC

## 2019-03-13 NOTE — ED Provider Notes (Signed)
Lucas Black    CSN: [id number] Arrival date & time: 03/13/19  1028      History   Chief Complaint Chief Complaint  Patient presents with  . chest congestion    HPI Lucas Black is a 79 y.o. male.   Patient presents with chest congestion and cough productive of thick yellow mucus x3 to 4 days.  He denies fever, chills, shortness of breath, vomiting, diarrhea, rash, or other symptoms.  Patient has a history of COPD and is a former smoker.  He has attempted treatment at home with NyQuil and Zicam.  The history is provided by the patient.    Past Medical History:  Diagnosis Date  . COPD (chronic obstructive pulmonary disease) ([place]) 05/30/2011  . COPD exacerbation ([place]) 07/08/2015  . Gastric ulcer 04/26/2011   1970's   . Hyperlipidemia   . Hypertension   . Prostatic enlargement 11/30/2010  . Screening for colon cancer 2011   colonoscopy normal  . Seasonal allergies 06/24/2014    Patient Active Problem List   Diagnosis Date Noted  . 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 Seasonal allergies 06/24/2014  . Cerumen impaction 06/24/2014  . Personal history of other malignant neoplasm of skin 04/09/2014  . Screening for prostate cancer 12/06/2011  . Allergic rhinitis 12/06/2011  . COPD (chronic obstructive pulmonary disease) ([place]) 05/30/2011  . Gastric ulcer 04/26/2011  . Hyperlipidemia 11/30/2010  . Hypertension 11/30/2010  . Prostatic enlargement 11/30/2010    Past Surgical History:  Procedure Laterality Date  . CATARACT EXTRACTION Left 2015  . GASTRIC RESECTION  1973   secondary to ulcer  . PROSTATE SURGERY         Home Medications    Prior to Admission medications   Medication Sig Start Date End Date Taking? Authorizing Provider  albuterol (PROVENTIL HFA;VENTOLIN HFA) 108 (90 Base) MCG/ACT inhaler Inhale 2 puffs into the lungs every 6 (six) hours as needed for wheezing or shortness of breath. 01/24/16   Leone Haven, MD    aspirin 81 MG tablet Take 81 mg by mouth daily.    [provider]  azithromycin (ZITHROMAX) 250 MG tablet Take 1 tablet (250 mg total) by mouth daily. Take first 2 tablets together, then 1 every day until finished. 03/13/19   Sharion Balloon, NP  finasteride (PROSCAR) 5 MG tablet TAKE 1 TABLET DAILY 01/14/18   Leone Haven, MD  lisinopril (PRINIVIL,ZESTRIL) 10 MG tablet TAKE ONE-HALF (1/2) TABLET DAILY 01/14/18   Leone Haven, MD  montelukast (SINGULAIR) 10 MG tablet Take 1 tablet (10 mg total) by mouth at bedtime. 01/24/16   Leone Haven, MD  predniSONE (STERAPRED UNI-PAK 21 TAB) 10 MG (21) TBPK tablet Take by mouth daily. Take 6 tabs by mouth daily  for 1 day, then 5 tabs for 1 day, then 4 tabs for 1 day, then 3 tabs for 1 day, 2 tabs for 1 day, then 1 tab by mouth daily for 1 day 03/13/19   Sharion Balloon, NP  simvastatin (ZOCOR) 20 MG tablet TAKE 1 TABLET AT BEDTIME 01/14/18   Leone Haven, MD  SYMBICORT 80-4.5 MCG/ACT inhaler USE 2 INHALATIONS TWICE A DAY 04/07/18   Leone Haven, MD  tamsulosin (FLOMAX) 0.4 MG CAPS capsule Take 1 capsule (0.4 mg total) by mouth daily. 05/16/18   [name], MD    Family History Family History  Problem Relation Age of Onset  . Cancer Mother  colon cancer  . Heart disease Father   . Heart attack Brother   . Colon cancer Brother   . Cancer Brother        skin    Social History Social History   Tobacco Use  . Smoking status: Former Smoker    Quit date: 03/31/1984    Years since quitting: 34.9  . Smokeless tobacco: Never Used  Substance Use Topics  . Alcohol use: Yes    Comment: rarely  . Drug use: No     Allergies   Patient has no known allergies.   Review of Systems Review of Systems  Constitutional: Negative for chills and fever.  HENT: Positive for congestion. Negative for ear pain and sore throat.   Eyes: Negative for pain and visual disturbance.  Respiratory: Positive for cough. Negative  for shortness of breath.   Cardiovascular: Negative for chest pain and palpitations.  Gastrointestinal: Negative for abdominal pain, diarrhea, nausea and vomiting.  Genitourinary: Negative for dysuria and hematuria.  Musculoskeletal: Negative for arthralgias and back pain.  Skin: Negative for color change and rash.  Neurological: Negative for seizures and syncope.  All other systems reviewed and are negative.    Physical Exam Triage Vital Signs ED Triage Vitals  Enc Vitals Group     BP 03/13/19 1031 (!) 153/100     Pulse Rate 03/13/19 1031 (!) 101     Resp 03/13/19 1031 18     Temp 03/13/19 1031 98.7 F (37.1 C)     Temp Source 03/13/19 1031 Oral     SpO2 03/13/19 1031 94 %     Weight 03/13/19 1042 180 lb (81.6 kg)     Height 03/13/19 1042 5\' 6"  (1.676 m)     Head Circumference --      Peak Flow --      Pain Score --      Pain Loc --      Pain Edu? --      Excl. in [place]? --    No data found.  Updated Vital Signs BP (!) 153/100 (BP Location: Left Arm)   Pulse (!) 101   Temp 98.7 F (37.1 C) (Oral)   Resp 18   Ht 5\' 6"  (1.676 m)   Wt 180 lb (81.6 kg)   SpO2 94%   BMI 29.05 kg/m   Visual Acuity Right Eye Distance:   Left Eye Distance:   Bilateral Distance:    Right Eye Near:   Left Eye Near:    Bilateral Near:     Physical Exam Vitals and nursing note reviewed.  Constitutional:      Appearance: He is well-developed.  HENT:     Head: Normocephalic and atraumatic.     Right Ear: Tympanic membrane normal.     Left Ear: Tympanic membrane normal.     Nose: Nose normal.     Mouth/Throat:     Mouth: Mucous membranes are moist.     Pharynx: Oropharynx is clear.  Eyes:     Conjunctiva/sclera: Conjunctivae normal.  Cardiovascular:     Rate and Rhythm: Normal rate and regular rhythm.     Heart sounds: No murmur.  Pulmonary:     Effort: Pulmonary effort is normal. No respiratory distress.     Breath sounds: Rhonchi present.     Comments: Scattered rhonchi  throughout.  Abdominal:     General: Bowel sounds are normal.     Palpations: Abdomen is soft.     Tenderness: There is  no abdominal tenderness. There is no guarding or rebound.  Musculoskeletal:     Cervical back: Neck supple.  Skin:    General: Skin is warm and dry.     Findings: No rash.  Neurological:     General: No focal deficit present.     Mental Status: He is alert and oriented to person, place, and time.  Psychiatric:        Mood and Affect: Mood normal.        Behavior: Behavior normal.      UC Treatments / Results  Labs (all labs ordered are listed, but only abnormal results are displayed) Labs Reviewed  POC SARS CORONAVIRUS 2 AG -  ED    EKG   Radiology No results found.  Procedures Procedures (including critical care time)  Medications Ordered in UC Medications - No data to display  Initial Impression / Assessment and Plan / UC Course  I have reviewed the triage vital signs and the nursing notes.  Pertinent labs & imaging results that were available during my care of the patient were reviewed by me and considered in my medical decision making (see chart for details).   COPD exacerbation.  Patient declines CXR.  Treating with Zithromax and prednisone.  Instructed patient to follow-up with his primary care provider in 1 day.  POC COVID negative; PCR pending.  Instructed patient to self quarantine until the test result is back and to take Tylenol as needed for fever/discomfort.  Instructed patient to go to the emergency department if he develops high fever, shortness of breath, severe diarrhea, or other concerning symptoms.  Patient agrees with plan of care.     Final Clinical Impressions(s) / UC Diagnoses   Final diagnoses:  COPD exacerbation ([place])     Discharge Instructions     Take the prednisone and Zithromax as directed.    Follow-up with your primary care provider in 1 day.    Your rapid COVID test is negative; the send-out test is  pending.  You should self quarantine until your test result is back and is negative.    Go to the emergency department if you develop high fever, shortness of breath, severe diarrhea, or other concerning symptoms.         ED Prescriptions    Medication Sig Dispense Auth. Provider   predniSONE (STERAPRED UNI-PAK 21 TAB) 10 MG (21) TBPK tablet Take by mouth daily. Take 6 tabs by mouth daily  for 1 day, then 5 tabs for 1 day, then 4 tabs for 1 day, then 3 tabs for 1 day, 2 tabs for 1 day, then 1 tab by mouth daily for 1 day 21 tablet Sharion Balloon, NP   azithromycin (ZITHROMAX) 250 MG tablet Take 1 tablet (250 mg total) by mouth daily. Take first 2 tablets together, then 1 every day until finished. 6 tablet Sharion Balloon, NP     PDMP not reviewed this encounter.   Sharion Balloon, NP 03/13/19 1125

## 2019-03-13 NOTE — ED Triage Notes (Signed)
Patient in office today c/o Chest congestion x 3-4 day with yellow mucus and thick.  [id number] and zcam

## 2019-03-13 NOTE — Discharge Instructions (Addendum)
Take the prednisone and Zithromax as directed.    Follow-up with your primary care provider in 1 day.    Your rapid COVID test is negative; the send-out test is pending.  You should self quarantine until your test result is back and is negative.    Go to the emergency department if you develop high fever, shortness of breath, severe diarrhea, or other concerning symptoms.

## 2019-03-15 LAB — NOVEL CORONAVIRUS, NAA: SARS-CoV-2, NAA: NOT DETECTED

## 2019-03-23 DIAGNOSIS — H538 Other visual disturbances: Secondary | ICD-10-CM | POA: Diagnosis not present

## 2019-03-23 DIAGNOSIS — H353131 Nonexudative age-related macular degeneration, bilateral, early dry stage: Secondary | ICD-10-CM | POA: Diagnosis not present

## 2019-03-23 DIAGNOSIS — H02889 Meibomian gland dysfunction of unspecified eye, unspecified eyelid: Secondary | ICD-10-CM | POA: Diagnosis not present

## 2019-03-23 DIAGNOSIS — H26493 Other secondary cataract, bilateral: Secondary | ICD-10-CM | POA: Diagnosis not present

## 2019-03-24 ENCOUNTER — Other Ambulatory Visit: Payer: Self-pay

## 2019-03-24 ENCOUNTER — Ambulatory Visit (INDEPENDENT_AMBULATORY_CARE_PROVIDER_SITE_OTHER): Payer: Medicare Other | Admitting: Family Medicine

## 2019-03-24 ENCOUNTER — Encounter: Payer: Self-pay | Admitting: Family Medicine

## 2019-03-24 ENCOUNTER — Other Ambulatory Visit: Payer: Self-pay | Admitting: Family Medicine

## 2019-03-24 DIAGNOSIS — E785 Hyperlipidemia, unspecified: Secondary | ICD-10-CM

## 2019-03-24 DIAGNOSIS — J0140 Acute pansinusitis, unspecified: Secondary | ICD-10-CM

## 2019-03-24 DIAGNOSIS — J329 Chronic sinusitis, unspecified: Secondary | ICD-10-CM | POA: Insufficient documentation

## 2019-03-24 DIAGNOSIS — I10 Essential (primary) hypertension: Secondary | ICD-10-CM

## 2019-03-24 DIAGNOSIS — J309 Allergic rhinitis, unspecified: Secondary | ICD-10-CM

## 2019-03-24 MED ORDER — FLUTICASONE PROPIONATE 50 MCG/ACT NA SUSP: 2.0000 | Freq: Every day | NASAL | 0 refills | Status: DC

## 2019-03-24 MED ORDER — LISINOPRIL 10 MG PO TABS: 5.0000 mg | ORAL_TABLET | Freq: Every day | ORAL | 4 refills | Status: DC

## 2019-03-24 MED ORDER — BUDESONIDE-FORMOTEROL FUMARATE 80-4.5 MCG/ACT IN AERO: INHALATION_SPRAY | RESPIRATORY_TRACT | 4 refills | Status: DC

## 2019-03-24 MED ORDER — DOXYCYCLINE HYCLATE 100 MG PO TABS: 100.0000 mg | ORAL_TABLET | Freq: Two times a day (BID) | ORAL | 0 refills | Status: DC

## 2019-03-24 MED ORDER — MONTELUKAST SODIUM 10 MG PO TABS: 10.0000 mg | ORAL_TABLET | Freq: Every day | ORAL | 3 refills | Status: DC

## 2019-03-24 MED ORDER — SIMVASTATIN 20 MG PO TABS: 20.0000 mg | ORAL_TABLET | Freq: Every day | ORAL | 4 refills | Status: DC

## 2019-03-24 NOTE — Progress Notes (Signed)
Virtual Visit via telephone Note  This visit type was conducted due to national recommendations for restrictions regarding the COVID-19 pandemic (e.g. social distancing).  This format is felt to be most appropriate for this patient at this time.  All issues noted in this document were discussed and addressed.  No physical exam was performed (except for noted visual exam findings with Video Visits).   I connected with Lucas Black today at  9:30 AM EST by telephone and verified that I am speaking with the correct person using two identifiers. Location patient: car Location provider: work Persons participating in the virtual visit: patient, provider  I discussed the limitations, risks, security and privacy concerns of performing an evaluation and management service by telephone and the availability of in person appointments. I also discussed with the patient that there may be a patient responsible charge related to this service. The patient expressed understanding and agreed to proceed.  Interactive audio and video telecommunications were attempted between this provider and patient, however failed, due to patient having technical difficulties OR patient did not have access to video capability.  We continued and completed visit with audio only.   Reason for visit: follow-up  HPI: Congestion: Patient was seen at urgent care on 03/13/2019 for this.  He had negative PCR and antigen testing for Covid.  They treated him with prednisone and azithromycin.  He continues to have some cough and mucus production that is yellow.  He has congestion in his sinuses and is blowing yellow mucus out of his nose.  No fevers.  Minimal dyspnea that is stable over the last several weeks.  He did finish the Z-Pak and prednisone which did help some though did not resolve his symptoms.  He is using Symbicort daily.  Hypertension: BP was elevated at urgent care.  He was taking lots of over-the-counter medications to help with  his symptoms.  No chest pain.  No edema.  He is taking lisinopril.  Does not check his blood pressure at home.  Hyperlipidemia: Taking simvastatin.  No right upper quadrant pain or myalgias.  Dry mouth: Patient notes this only occurs at night.  He will get up and drink water and it will resolve.  He does note nasal congestion at night.  He does not appear to be on any medicines that would contribute to dry mouth.   ROS: See pertinent positives and negatives per HPI.  Past Medical History:  Diagnosis Date  . COPD (chronic obstructive pulmonary disease) ([place]) 05/30/2011  . COPD exacerbation ([place]) 07/08/2015  . Gastric ulcer 04/26/2011   1970's   . Hyperlipidemia   . Hypertension   . Prostatic enlargement 11/30/2010  . Screening for colon cancer 2011   colonoscopy normal  . Seasonal allergies 06/24/2014    Past Surgical History:  Procedure Laterality Date  . CATARACT EXTRACTION Left 2015  . GASTRIC RESECTION  1973   secondary to ulcer  . PROSTATE SURGERY      Family History  Problem Relation Age of Onset  . Cancer Mother        colon cancer  . Heart disease Father   . Heart attack Brother   . Colon cancer Brother   . Cancer Brother        skin    SOCIAL HX: Former smoker   Current Outpatient Medications:  .  albuterol (PROVENTIL HFA;VENTOLIN HFA) 108 (90 Base) MCG/ACT inhaler, Inhale 2 puffs into the lungs every 6 (six) hours as needed for wheezing or  shortness of breath., Disp: 3 Inhaler, Rfl: 1 .  aspirin 81 MG tablet, Take 81 mg by mouth daily., Disp: , Rfl:  .  budesonide-formoterol (SYMBICORT) 80-4.5 MCG/ACT inhaler, USE 2 INHALATIONS TWICE A DAY, Disp: 30.6 g, Rfl: 4 .  finasteride (PROSCAR) 5 MG tablet, TAKE 1 TABLET DAILY, Disp: 90 tablet, Rfl: 4 .  lisinopril (ZESTRIL) 10 MG tablet, Take 0.5 tablets (5 mg total) by mouth daily., Disp: 45 tablet, Rfl: 4 .  montelukast (SINGULAIR) 10 MG tablet, Take 1 tablet (10 mg total) by mouth at bedtime., Disp: 90 tablet, Rfl:  3 .  simvastatin (ZOCOR) 20 MG tablet, Take 1 tablet (20 mg total) by mouth at bedtime., Disp: 90 tablet, Rfl: 4 .  tamsulosin (FLOMAX) 0.4 MG CAPS capsule, Take 1 capsule (0.4 mg total) by mouth daily., Disp: 90 capsule, Rfl: 3 .  doxycycline (VIBRA-TABS) 100 MG tablet, Take 1 tablet (100 mg total) by mouth 2 (two) times daily., Disp: 14 tablet, Rfl: 0 .  fluticasone (FLONASE) 50 MCG/ACT nasal spray, Place 2 sprays into both nostrils daily., Disp: 16 g, Rfl: 0  EXAM: This is a telehealth telephone visit and thus no physical exam was completed.  ASSESSMENT AND PLAN:  Discussed the following assessment and plan:  Sinusitis I suspect there are some measure of sinusitis playing a role.  He continues to have sinus congestion and chest congestion with cough.  Discussed trial of doxycycline to cover both areas as a Z-Pak would not cover for sinus issues.  He will trial this and let us know later this week if he is not improving.  Allergic rhinitis I suspect this is leading to his dry mouth with congestion causing him to open his mouth at night.  We will trial Flonase in addition to his Singulair.  He denies history of glaucoma.  Hypertension Undetermined control.  He will check daily at home and then call us with his readings.  He will continue lisinopril.  Lab work and BP check with nursing in 1 month.  Hyperlipidemia Continue simvastatin.   Orders Placed This Encounter  Procedures  . Basic Metabolic Panel (BMET)    Standing Status:   Future    Standing Expiration Date:   03/23/2020    Meds ordered this encounter  Medications  . budesonide-formoterol (SYMBICORT) 80-4.5 MCG/ACT inhaler    Sig: USE 2 INHALATIONS TWICE A DAY    Dispense:  30.6 g    Refill:  4  . simvastatin (ZOCOR) 20 MG tablet    Sig: Take 1 tablet (20 mg total) by mouth at bedtime.    Dispense:  90 tablet    Refill:  4  . montelukast (SINGULAIR) 10 MG tablet    Sig: Take 1 tablet (10 mg total) by mouth at  bedtime.    Dispense:  90 tablet    Refill:  3  . lisinopril (ZESTRIL) 10 MG tablet    Sig: Take 0.5 tablets (5 mg total) by mouth daily.    Dispense:  45 tablet    Refill:  4  . doxycycline (VIBRA-TABS) 100 MG tablet    Sig: Take 1 tablet (100 mg total) by mouth 2 (two) times daily.    Dispense:  14 tablet    Refill:  0  . fluticasone (FLONASE) 50 MCG/ACT nasal spray    Sig: Place 2 sprays into both nostrils daily.    Dispense:  16 g    Refill:  0     I discussed the assessment  and treatment plan with the patient. The patient was provided an opportunity to ask questions and all were answered. The patient agreed with the plan and demonstrated an understanding of the instructions.   The patient was advised to call back or seek an in-person evaluation if the symptoms worsen or if the condition fails to improve as anticipated.  I provided 12 minutes of non-face-to-face time during this encounter.   Tommi Rumps, MD

## 2019-03-24 NOTE — Assessment & Plan Note (Signed)
I suspect this is leading to his dry mouth with congestion causing him to open his mouth at night.  We will trial Flonase in addition to his Singulair.  He denies history of glaucoma.

## 2019-03-24 NOTE — Assessment & Plan Note (Signed)
I suspect there are some measure of sinusitis playing a role.  He continues to have sinus congestion and chest congestion with cough.  Discussed trial of doxycycline to cover both areas as a Z-Pak would not cover for sinus issues.  He will trial this and let us know later this week if he is not improving.

## 2019-03-24 NOTE — Assessment & Plan Note (Addendum)
Undetermined control.  He will check daily at home and then call us with his readings.  He will continue lisinopril.  Lab work and BP check with nursing in 1 month.

## 2019-03-24 NOTE — Assessment & Plan Note (Signed)
Continue simvastatin. 

## 2019-03-26 ENCOUNTER — Other Ambulatory Visit: Payer: Self-pay | Admitting: Urology

## 2019-03-26 DIAGNOSIS — N401 Enlarged prostate with lower urinary tract symptoms: Secondary | ICD-10-CM

## 2019-04-09 ENCOUNTER — Encounter: Payer: Self-pay | Admitting: General Practice

## 2019-04-20 ENCOUNTER — Telehealth: Payer: Self-pay | Admitting: Family Medicine

## 2019-04-20 ENCOUNTER — Other Ambulatory Visit: Payer: Self-pay

## 2019-04-20 DIAGNOSIS — N401 Enlarged prostate with lower urinary tract symptoms: Secondary | ICD-10-CM

## 2019-04-20 MED ORDER — FINASTERIDE 5 MG PO TABS: 5.0000 mg | ORAL_TABLET | Freq: Every day | ORAL | 4 refills | Status: DC

## 2019-04-20 NOTE — Telephone Encounter (Signed)
Pt needs refill on finasteride (PROSCAR) 5 MG tablet sent to [facility]

## 2019-04-20 NOTE — Progress Notes (Signed)
Medication for prostates sent to pharmacy per patient request.  Lucas Black,cma

## 2019-04-29 ENCOUNTER — Ambulatory Visit: Payer: Medicare Other

## 2019-04-29 ENCOUNTER — Other Ambulatory Visit (INDEPENDENT_AMBULATORY_CARE_PROVIDER_SITE_OTHER): Payer: Medicare Other

## 2019-04-29 ENCOUNTER — Other Ambulatory Visit: Payer: Self-pay

## 2019-04-29 DIAGNOSIS — I10 Essential (primary) hypertension: Secondary | ICD-10-CM

## 2019-04-29 LAB — BASIC METABOLIC PANEL
BUN: 19 mg/dL (ref 6–23)
CO2: 28 mEq/L (ref 19–32)
Calcium: 10.1 mg/dL (ref 8.4–10.5)
Chloride: 106 mEq/L (ref 96–112)
Creatinine, Ser: 1.46 mg/dL (ref 0.40–1.50)
GFR: 46.51 mL/min — ABNORMAL LOW (ref >60.00)
Glucose, Bld: 99 mg/dL (ref 70–99)
Potassium: 4.7 mEq/L (ref 3.5–5.1)
Sodium: 139 mEq/L (ref 135–145)

## 2019-04-29 NOTE — Progress Notes (Signed)
Patient is here for a BP check due to bp being high at last visit, as per patient.  Currently patients BP is 148/72 and BPM is 62.  Patient has no complaints of headaches, blurry vision, chest pain, arm pain, light headedness, dizziness, and nor jaw pain

## 2019-04-30 ENCOUNTER — Encounter: Payer: Self-pay | Admitting: General Practice

## 2019-05-15 ENCOUNTER — Encounter: Payer: Self-pay | Admitting: Urology

## 2019-05-15 ENCOUNTER — Ambulatory Visit (INDEPENDENT_AMBULATORY_CARE_PROVIDER_SITE_OTHER): Payer: Medicare Other | Admitting: Urology

## 2019-05-15 ENCOUNTER — Other Ambulatory Visit: Payer: Self-pay

## 2019-05-15 VITALS — BP 157/82 | HR 58 | Ht 67.0 in | Wt 180.0 lb

## 2019-05-15 DIAGNOSIS — R350 Frequency of micturition: Secondary | ICD-10-CM | POA: Diagnosis not present

## 2019-05-15 DIAGNOSIS — N401 Enlarged prostate with lower urinary tract symptoms: Secondary | ICD-10-CM

## 2019-05-15 LAB — BLADDER SCAN AMB NON-IMAGING: Scan Result: 28

## 2019-05-15 NOTE — Progress Notes (Signed)
05/15/2019 10:39 AM   Lucas Black 05-10-1940 [id number]  Referring provider: Leone Haven, MD [address] [place],  [place] [address]  Chief Complaint  Patient presents with  . Benign Prostatic Hypertrophy    Urologic history: 1.  BPH with lower urinary tract symptoms             -Outlet procedure 2004 for BPH; records not available             -Recurrent symptoms around 2014 and started on combination therapy             -Initially seen here 02/2017   HPI: 79 y.o. male presents for annual follow-up.  -Remains on tamsulosin/finasteride -Stable lower urinary tract symptoms which are not bothersome -Denies dysuria, gross hematuria -No flank, abdominal or pelvic pain   PMH: Past Medical History:  Diagnosis Date  . COPD (chronic obstructive pulmonary disease) ([place]) 05/30/2011  . COPD exacerbation ([place]) 07/08/2015  . Gastric ulcer 04/26/2011   1970's   . Hyperlipidemia   . Hypertension   . Prostatic enlargement 11/30/2010  . Screening for colon cancer 2011   colonoscopy normal  . Seasonal allergies 06/24/2014    Surgical History: Past Surgical History:  Procedure Laterality Date  . CATARACT EXTRACTION Left 2015  . GASTRIC RESECTION  1973   secondary to ulcer  . PROSTATE SURGERY      Home Medications:  Allergies as of 05/15/2019   No Known Allergies     Medication List       Accurate as of May 15, 2019 10:39 AM. If you have any questions, ask your nurse or doctor.        albuterol 108 (90 Base) MCG/ACT inhaler Commonly known as: VENTOLIN HFA Inhale 2 puffs into the lungs every 6 (six) hours as needed for wheezing or shortness of breath.   aspirin 81 MG tablet Take 81 mg by mouth daily.   budesonide-formoterol 80-4.5 MCG/ACT inhaler Commonly known as: Symbicort USE 2 INHALATIONS TWICE A DAY   doxycycline 100 MG tablet Commonly known as: VIBRA-TABS Take 1 tablet (100 mg total) by mouth 2 (two) times daily.   finasteride 5 MG  tablet Commonly known as: PROSCAR Take 1 tablet (5 mg total) by mouth daily.   fluticasone 50 MCG/ACT nasal spray Commonly known as: FLONASE SHAKE LIQUID AND USE 2 SPRAYS IN EACH NOSTRIL DAILY   lisinopril 10 MG tablet Commonly known as: ZESTRIL Take 0.5 tablets (5 mg total) by mouth daily.   montelukast 10 MG tablet Commonly known as: SINGULAIR Take 1 tablet (10 mg total) by mouth at bedtime.   simvastatin 20 MG tablet Commonly known as: ZOCOR Take 1 tablet (20 mg total) by mouth at bedtime.   tamsulosin 0.4 MG Caps capsule Commonly known as: FLOMAX TAKE 1 CAPSULE DAILY       Allergies: No Known Allergies  Family History: Family History  Problem Relation Age of Onset  . Cancer Mother        colon cancer  . Heart disease Father   . Heart attack Brother   . Colon cancer Brother   . Cancer Brother        skin    Social History:  reports that he quit smoking about 35 years ago. He has never used smokeless tobacco. He reports current alcohol use. He reports that he does not use drugs.   Physical Exam: BP (!) 157/82   Pulse (!) 58   Ht 5\' 7"  (  1.702 m)   Wt 180 lb (81.6 kg)   BMI 28.19 kg/m   Constitutional:  Alert and oriented, No acute distress. HEENT: [place] AT, moist mucus membranes.  Trachea midline, no masses. Cardiovascular: No clubbing, cyanosis, or edema. Respiratory: Normal respiratory effort, no increased work of breathing. Skin: No rashes, bruises or suspicious lesions. Neurologic: Grossly intact, no focal deficits, moving all 4 extremities. Psychiatric: Normal mood and affect.   Assessment & Plan:    - Benign prostatic hyperplasia with urinary frequency Stable lower urinary tract symptoms on combination therapy.  He did not need refills.  PVR by bladder scan today was 28 mL.  Continue annual follow-up   [name], MD  [facility] [address] [place], [place] [address] [phone]

## 2019-07-17 ENCOUNTER — Telehealth: Payer: Self-pay

## 2019-07-17 ENCOUNTER — Ambulatory Visit: Payer: Medicare Other

## 2019-07-17 NOTE — Telephone Encounter (Signed)
Failed attempt in reaching patient for scheduled awv. No answer. Left message to call nurse back in allotted timeframe or reschedule as appropriate.

## 2019-09-22 ENCOUNTER — Encounter: Payer: Self-pay | Admitting: Family Medicine

## 2019-09-22 ENCOUNTER — Other Ambulatory Visit: Payer: Self-pay

## 2019-09-22 ENCOUNTER — Ambulatory Visit (INDEPENDENT_AMBULATORY_CARE_PROVIDER_SITE_OTHER): Payer: Medicare Other | Admitting: Family Medicine

## 2019-09-22 DIAGNOSIS — N401 Enlarged prostate with lower urinary tract symptoms: Secondary | ICD-10-CM | POA: Diagnosis not present

## 2019-09-22 DIAGNOSIS — E785 Hyperlipidemia, unspecified: Secondary | ICD-10-CM

## 2019-09-22 DIAGNOSIS — I10 Essential (primary) hypertension: Secondary | ICD-10-CM | POA: Diagnosis not present

## 2019-09-22 DIAGNOSIS — J449 Chronic obstructive pulmonary disease, unspecified: Secondary | ICD-10-CM | POA: Diagnosis not present

## 2019-09-22 DIAGNOSIS — R350 Frequency of micturition: Secondary | ICD-10-CM | POA: Diagnosis not present

## 2019-09-22 LAB — COMPREHENSIVE METABOLIC PANEL
ALT: 9 U/L (ref 0–53)
AST: 13 U/L (ref 0–37)
Albumin: 4.3 g/dL (ref 3.5–5.2)
Alkaline Phosphatase: 73 U/L (ref 39–117)
BUN: 21 mg/dL (ref 6–23)
CO2: 27 mEq/L (ref 19–32)
Calcium: 9.6 mg/dL (ref 8.4–10.5)
Chloride: 107 mEq/L (ref 96–112)
Creatinine, Ser: 1.77 mg/dL — ABNORMAL HIGH (ref 0.40–1.50)
GFR: 37.2 mL/min — ABNORMAL LOW (ref >60.00)
Glucose, Bld: 102 mg/dL — ABNORMAL HIGH (ref 70–99)
Potassium: 4.2 mEq/L (ref 3.5–5.1)
Sodium: 139 mEq/L (ref 135–145)
Total Bilirubin: 0.5 mg/dL (ref 0.2–1.2)
Total Protein: 6.3 g/dL (ref 6.0–8.3)

## 2019-09-22 LAB — LIPID PANEL
Cholesterol: 147 mg/dL (ref 0–200)
HDL: 57.7 mg/dL (ref >39.00)
LDL Cholesterol: 75 mg/dL (ref 0–99)
NonHDL: 89.34
Total CHOL/HDL Ratio: 3
Triglycerides: 73 mg/dL (ref 0.0–149.0)
VLDL: 14.6 mg/dL (ref 0.0–40.0)

## 2019-09-22 MED ORDER — TETANUS-DIPHTHERIA TOXOIDS TD 2-2 LF/0.5ML IM SUSP: 0.5000 mL | Freq: Once | INTRAMUSCULAR | 0 refills | Status: AC

## 2019-09-22 NOTE — Assessment & Plan Note (Signed)
Adequate control for his age.  We will continue lisinopril.  CMP and lipid panel today.

## 2019-09-22 NOTE — Patient Instructions (Signed)
Nice to see you. We will check labs. Please confirm if you are on Proscar once you get home.

## 2019-09-22 NOTE — Assessment & Plan Note (Signed)
Well-controlled.  Continue Symbicort.

## 2019-09-22 NOTE — Assessment & Plan Note (Signed)
Adequately controlled.  He will continue his current regimen.  He will confirm whether or not he is on Proscar at this time once he gets home.

## 2019-09-22 NOTE — Assessment & Plan Note (Signed)
Check lipid panel  

## 2019-09-22 NOTE — Progress Notes (Signed)
  Tommi Rumps, MD Phone: [phone]  Lucas Black is a 79 y.o. male who presents today for f/u.  HYPERTENSION  Disease Monitoring  Home BP Monitoring not checking Chest pain- no    Dyspnea- no Medications  Compliance-  Taking lisinopril.   Edema- no  BPH: Strain- no Flow- good Frequency- no Urgency- occasional  Nocturia- 2x Dysuria- no Emptying bladder- yes Medication- flomax, notes he is not on proscar at this time   COPD: Medication compliance- symbicort BID  Rescue inhaler use- no Dyspnea- no  Wheezing- rare  Cough- no         Social History   Tobacco Use  Smoking Status Former Smoker  . Quit date: 03/31/1984  . Years since quitting: 35.5  Smokeless Tobacco Never Used     ROS see history of present illness  Objective  Physical Exam Vitals:   09/22/19 0828  BP: 140/80  Pulse: 67  Temp: 97.7 F (36.5 C)  SpO2: 98%    BP Readings from Last 3 Encounters:  09/22/19 140/80  05/15/19 (!) 157/82  03/13/19 (!) 153/100   Wt Readings from Last 3 Encounters:  09/22/19 186 lb (84.4 kg)  05/15/19 180 lb (81.6 kg)  03/24/19 188 lb (85.3 kg)    Physical Exam Constitutional:      General: He is not in acute distress.    Appearance: He is not diaphoretic.  Cardiovascular:     Rate and Rhythm: Normal rate and regular rhythm.     Heart sounds: Normal heart sounds.  Pulmonary:     Effort: Pulmonary effort is normal.     Breath sounds: Normal breath sounds.  Musculoskeletal:     Right lower leg: No edema.     Left lower leg: No edema.  Skin:    General: Skin is warm and dry.  Neurological:     Mental Status: He is alert.      Assessment/Plan: Please see individual problem list.  Hypertension Adequate control for his age.  We will continue lisinopril.  CMP and lipid panel today.  COPD (chronic obstructive pulmonary disease) ([place]) Well-controlled.  Continue Symbicort.  Hyperlipidemia Check lipid panel.  Benign prostatic hyperplasia with  urinary frequency Adequately controlled.  He will continue his current regimen.  He will confirm whether or not he is on Proscar at this time once he gets home.   Health Maintenance: he will get his Td at the pharmacy.   Orders Placed This Encounter  Procedures  . Comp Met (CMET)  . Lipid panel    Meds ordered this encounter  Medications  . diptheria-tetanus toxoids (TDVAX) 2-2 LF/0.5ML injection    Sig: Inject 0.5 mLs into the muscle once for 1 dose.    Dispense:  0.5 mL    Refill:  0    This visit occurred during the SARS-CoV-2 public health emergency.  Safety protocols were in place, including screening questions prior to the visit, additional usage of staff PPE, and extensive cleaning of exam room while observing appropriate contact time as indicated for disinfecting solutions.    Tommi Rumps, MD [place]

## 2019-09-30 ENCOUNTER — Telehealth: Payer: Self-pay

## 2019-09-30 DIAGNOSIS — N1832 Chronic kidney disease, stage 3b: Secondary | ICD-10-CM

## 2019-10-01 NOTE — Telephone Encounter (Signed)
I informed the referral clerk the the referral was placed they will call and schedule with patient.  Arbor Cohen,cma

## 2019-10-01 NOTE — Telephone Encounter (Signed)
Referral placed.

## 2019-10-26 ENCOUNTER — Ambulatory Visit (INDEPENDENT_AMBULATORY_CARE_PROVIDER_SITE_OTHER): Payer: Medicare Other

## 2019-10-26 VITALS — Ht 67.0 in | Wt 186.0 lb

## 2019-10-26 DIAGNOSIS — Z0000 Encounter for general adult medical examination without abnormal findings: Secondary | ICD-10-CM | POA: Diagnosis not present

## 2019-10-26 NOTE — Patient Instructions (Addendum)
Lucas Black , Thank you for taking time to come for your Medicare Wellness Visit. I appreciate your ongoing commitment to your health goals. Please review the following plan we discussed and let me know if I can assist you in the future.   These are the goals we discussed: Goals      Patient Stated   .  Eat a healthier diet (pt-stated)      Try to drink more water, less caffeine. (Currently drinks 11 cups of coffee daily, no water)       This is a list of the screening recommended for you and due dates:  Health Maintenance  Topic Date Due  . Flu Shot  01/26/2020*  . Tetanus Vaccine  10/25/2020*  . COVID-19 Vaccine  Completed  . Pneumonia vaccines  Completed  *Topic was postponed. The date shown is not the original due date.    Immunizations Immunization History  Administered Date(s) Administered  . Influenza Split 11/30/2010  . Influenza, High Dose Seasonal PF 11/17/2016, 11/15/2017  . Influenza,inj,Quad PF,6+ Mos 12/19/2012, 12/23/2013, 12/15/2014  . Influenza-Unspecified 11/23/2015, 11/03/2017  . PFIZER SARS-COV-2 Vaccination 04/09/2019, 04/30/2019  . Pneumococcal Conjugate-13 06/23/2013  . Pneumococcal Polysaccharide-23 12/06/2007   Keep all routine maintenance appointments.   Follow up 03/22/20  Advanced directives: mailed per patient request  Conditions/risks identified: none new  Follow up in one year for your annual wellness visit.   Preventive Care 36 Years and Older, Male Preventive care refers to lifestyle choices and visits with your health care provider that can promote health and wellness. What does preventive care include?  A yearly physical exam. This is also called an annual well check.  Dental exams once or twice a year.  Routine eye exams. Ask your health care provider how often you should have your eyes checked.  Personal lifestyle choices, including:  Daily care of your teeth and gums.  Regular physical activity.  Eating a healthy  diet.  Avoiding tobacco and drug use.  Limiting alcohol use.  Practicing safe sex.  Taking low doses of aspirin every day.  Taking vitamin and mineral supplements as recommended by your health care provider. What happens during an annual well check? The services and screenings done by your health care provider during your annual well check will depend on your age, overall health, lifestyle risk factors, and family history of disease. Counseling  Your health care provider may ask you questions about your:  Alcohol use.  Tobacco use.  Drug use.  Emotional well-being.  Home and relationship well-being.  Sexual activity.  Eating habits.  History of falls.  Memory and ability to understand (cognition).  Work and work Statistician. Screening  You may have the following tests or measurements:  Height, weight, and BMI.  Blood pressure.  Lipid and cholesterol levels. These may be checked every 5 years, or more frequently if you are over 35 years old.  Skin check.  Lung cancer screening. You may have this screening every year starting at age 33 if you have a 30-pack-year history of smoking and currently smoke or have quit within the past 15 years.  Fecal occult blood test (FOBT) of the stool. You may have this test every year starting at age 46.  Flexible sigmoidoscopy or colonoscopy. You may have a sigmoidoscopy every 5 years or a colonoscopy every 10 years starting at age 20.  Prostate cancer screening. Recommendations will vary depending on your family history and other risks.  Hepatitis C blood test.  Hepatitis  B blood test.  Sexually transmitted disease (STD) testing.  Diabetes screening. This is done by checking your blood sugar (glucose) after you have not eaten for a while (fasting). You may have this done every 1-3 years.  Abdominal aortic aneurysm (AAA) screening. You may need this if you are a current or former smoker.  Osteoporosis. You may be screened  starting at age 97 if you are at high risk. Talk with your health care provider about your test results, treatment options, and if necessary, the need for more tests. Vaccines  Your health care provider may recommend certain vaccines, such as:  Influenza vaccine. This is recommended every year.  Tetanus, diphtheria, and acellular pertussis (Tdap, Td) vaccine. You may need a Td booster every 10 years.  Zoster vaccine. You may need this after age 67.  Pneumococcal 13-valent conjugate (PCV13) vaccine. One dose is recommended after age 58.  Pneumococcal polysaccharide (PPSV23) vaccine. One dose is recommended after age 4. Talk to your health care provider about which screenings and vaccines you need and how often you need them. This information is not intended to replace advice given to you by your health care provider. Make sure you discuss any questions you have with your health care provider. Document Released: 03/18/2015 Document Revised: 11/09/2015 Document Reviewed: 12/21/2014 Elsevier Interactive Patient Education  2017 [place] Prevention in the Home Falls can cause injuries. They can happen to people of all ages. There are many things you can do to make your home safe and to help prevent falls. What can I do on the outside of my home?  Regularly fix the edges of walkways and driveways and fix any cracks.  Remove anything that might make you trip as you walk through a door, such as a raised step or threshold.  Trim any bushes or trees on the path to your home.  Use bright outdoor lighting.  Clear any walking paths of anything that might make someone trip, such as rocks or tools.  Regularly check to see if handrails are loose or broken. Make sure that both sides of any steps have handrails.  Any raised decks and porches should have guardrails on the edges.  Have any leaves, snow, or ice cleared regularly.  Use sand or salt on walking paths during  winter.  Clean up any spills in your garage right away. This includes oil or grease spills. What can I do in the bathroom?  Use night lights.  Install grab bars by the toilet and in the tub and shower. Do not use towel bars as grab bars.  Use non-skid mats or decals in the tub or shower.  If you need to sit down in the shower, use a plastic, non-slip stool.  Keep the floor dry. Clean up any water that spills on the floor as soon as it happens.  Remove soap buildup in the tub or shower regularly.  Attach bath mats securely with double-sided non-slip rug tape.  Do not have throw rugs and other things on the floor that can make you trip. What can I do in the bedroom?  Use night lights.  Make sure that you have a light by your bed that is easy to reach.  Do not use any sheets or blankets that are too big for your bed. They should not hang down onto the floor.  Have a firm chair that has side arms. You can use this for support while you get dressed.  Do not have  throw rugs and other things on the floor that can make you trip. What can I do in the kitchen?  Clean up any spills right away.  Avoid walking on wet floors.  Keep items that you use a lot in easy-to-reach places.  If you need to reach something above you, use a strong step stool that has a grab bar.  Keep electrical cords out of the way.  Do not use floor polish or wax that makes floors slippery. If you must use wax, use non-skid floor wax.  Do not have throw rugs and other things on the floor that can make you trip. What can I do with my stairs?  Do not leave any items on the stairs.  Make sure that there are handrails on both sides of the stairs and use them. Fix handrails that are broken or loose. Make sure that handrails are as long as the stairways.  Check any carpeting to make sure that it is firmly attached to the stairs. Fix any carpet that is loose or worn.  Avoid having throw rugs at the top or  bottom of the stairs. If you do have throw rugs, attach them to the floor with carpet tape.  Make sure that you have a light switch at the top of the stairs and the bottom of the stairs. If you do not have them, ask someone to add them for you. What else can I do to help prevent falls?  Wear shoes that:  Do not have high heels.  Have rubber bottoms.  Are comfortable and fit you well.  Are closed at the toe. Do not wear sandals.  If you use a stepladder:  Make sure that it is fully opened. Do not climb a closed stepladder.  Make sure that both sides of the stepladder are locked into place.  Ask someone to hold it for you, if possible.  Clearly mark and make sure that you can see:  Any grab bars or handrails.  First and last steps.  Where the edge of each step is.  Use tools that help you move around (mobility aids) if they are needed. These include:  Canes.  Walkers.  Scooters.  Crutches.  Turn on the lights when you go into a dark area. Replace any light bulbs as soon as they burn out.  Set up your furniture so you have a clear path. Avoid moving your furniture around.  If any of your floors are uneven, fix them.  If there are any pets around you, be aware of where they are.  Review your medicines with your doctor. Some medicines can make you feel dizzy. This can increase your chance of falling. Ask your doctor what other things that you can do to help prevent falls. This information is not intended to replace advice given to you by your health care provider. Make sure you discuss any questions you have with your health care provider. Document Released: 12/16/2008 Document Revised: 07/28/2015 Document Reviewed: 03/26/2014 Elsevier Interactive Patient Education  2017 [employer].

## 2019-10-26 NOTE — Progress Notes (Signed)
Subjective:   Lucas Black is a 79 y.o. male who presents for Medicare Annual/Subsequent preventive examination.  Review of Systems    No ROS.  Medicare Wellness Virtual Visit.    Cardiac Risk Factors include: advanced age (>2men, >24 women);male gender;hypertension     Objective:    Today's Vitals   10/26/19 1440  Weight: 186 lb (84.4 kg)  Height: 5\' 7"  (1.702 m)   Body mass index is 29.13 kg/m.  Advanced Directives 10/26/2019 07/16/2018 07/12/2017 06/22/2016 06/23/2015  Does Patient Have a Medical Advance Directive? No Yes No No No  Type of Advance Directive - Healthcare Power of Lucas Black;Living will Living will;Healthcare Power of Attorney - -  Does patient want to make changes to medical advance directive? - No - Patient declined Yes (MAU/Ambulatory/Procedural Areas - Information given) - -  Copy of Lucas Black in Chart? - Yes - validated most recent copy scanned in chart (See row information) - - -  Would patient like information on creating a medical advance directive? Yes (MAU/Ambulatory/Procedural Areas - Information given) - - No - Patient declined Yes - Educational materials given    Current Medications (verified) Outpatient Encounter Medications as of 10/26/2019  Medication Sig   albuterol (PROVENTIL HFA;VENTOLIN HFA) 108 (90 Base) MCG/ACT inhaler Inhale 2 puffs into the lungs every 6 (six) hours as needed for wheezing or shortness of breath.   aspirin 81 MG tablet Take 81 mg by mouth daily.   budesonide-formoterol (SYMBICORT) 80-4.5 MCG/ACT inhaler USE 2 INHALATIONS TWICE A DAY   doxycycline (VIBRA-TABS) 100 MG tablet Take 1 tablet (100 mg total) by mouth 2 (two) times daily.   finasteride (PROSCAR) 5 MG tablet Take 1 tablet (5 mg total) by mouth daily.   fluticasone (FLONASE) 50 MCG/ACT nasal spray SHAKE LIQUID AND USE 2 SPRAYS IN EACH NOSTRIL DAILY   lisinopril (ZESTRIL) 10 MG tablet Take 0.5 tablets (5 mg total) by mouth daily.    montelukast (SINGULAIR) 10 MG tablet Take 1 tablet (10 mg total) by mouth at bedtime.   simvastatin (ZOCOR) 20 MG tablet Take 1 tablet (20 mg total) by mouth at bedtime.   tamsulosin (FLOMAX) 0.4 MG CAPS capsule TAKE 1 CAPSULE DAILY   No facility-administered encounter medications on file as of 10/26/2019.    Allergies (verified) Patient has no known allergies.   History: Past Medical History:  Diagnosis Date   COPD (chronic obstructive pulmonary disease) ([place]) 05/30/2011   COPD exacerbation ([place]) 07/08/2015   Gastric ulcer 04/26/2011   1970's    Hyperlipidemia    Hypertension    Prostatic enlargement 11/30/2010   Screening for colon cancer 2011   colonoscopy normal   Seasonal allergies 06/24/2014   Past Surgical History:  Procedure Laterality Date   CATARACT EXTRACTION Left 2015   GASTRIC RESECTION  1973   secondary to ulcer   PROSTATE SURGERY     Family History  Problem Relation Age of Onset   Cancer Mother        colon cancer   Heart disease Father    Heart attack Brother    Colon cancer Brother    Cancer Brother        skin   Social History   Socioeconomic History   Marital status: Married    Spouse name: Not on file   Number of children: Not on file   Years of education: Not on file   Highest education level: Not on file  Occupational History  Employer: retired  Tobacco Use   Smoking status: Former Smoker    Quit date: 03/31/1984    Years since quitting: 35.5   Smokeless tobacco: Never Used  Vaping Use   Vaping Use: Never used  Substance and Sexual Activity   Alcohol use: Yes    Comment: rarely   Drug use: No   Sexual activity: Yes  Other Topics Concern   Not on file  Social History Narrative   Not on file   Social Determinants of Health   Financial Resource Strain: Low Risk    Difficulty of Paying Living Expenses: Not hard at all  Food Insecurity: No Food Insecurity   Worried About Charity fundraiser in the Last  Year: Never true   [name] in the Last Year: Never true  Transportation Needs: No Transportation Needs   Lack of Transportation (Medical): No   Lack of Transportation (Non-Medical): No  Physical Activity:    Days of Exercise per Week: Not on file   Minutes of Exercise per Session: Not on file  Stress: No Stress Concern Present   Feeling of Stress : Not at all  Social Connections: Unknown   Frequency of Communication with Friends and Family: Not on file   Frequency of Social Gatherings with Friends and Family: Not on file   Attends Religious Services: Not on Electrical engineer or Organizations: Not on file   Attends Archivist Meetings: Not on file   Marital Status: Married    Tobacco Counseling Counseling given: Not Answered   Clinical Intake:  Pre-visit preparation completed: Yes        Diabetes: No  How often do you need to have someone help you when you read instructions, pamphlets, or other written materials from your doctor or pharmacy?: 1 - Never   Interpreter Needed?: No      Activities of Daily Living In your present state of health, do you have any difficulty performing the following activities: 10/26/2019  Hearing? Y  Vision? N  Difficulty concentrating or making decisions? N  Walking or climbing stairs? Y  Comment Unsteady gait at times. Paces himself.  Dressing or bathing? N  Doing errands, shopping? N  Using the Toilet? N  In the past six months, have you accidently leaked urine? N  Do you have problems with loss of bowel control? N  Managing your Medications? N  Managing your Finances? Y  Comment Wife Runner, broadcasting/film/video or managing your Housekeeping? N  Some recent data might be hidden    Patient Care Team: Lucas Haven, MD as PCP - General (Family Medicine)  Indicate any recent Medical Services you may have received from other than [facility] providers in the past year (date may be  approximate).     Assessment:   This is a routine wellness examination for Lucas Black.  I connected with Lucas Black today by telephone and verified that I am speaking with the correct person using two identifiers. Location patient: home Location provider: work Persons participating in the virtual visit: patient, Marine scientist.    I discussed the limitations, risks, security and privacy concerns of performing an evaluation and management service by telephone and the availability of in person appointments. The patient expressed understanding and verbally consented to this telephonic visit.    Interactive audio and video telecommunications were attempted between this provider and patient, however failed, due to patient having technical difficulties OR patient did not have access to video  capability.  We continued and completed visit with audio only.  Some vital signs may be absent or patient reported.   Hearing/Vision screen  Hearing Screening   125Hz  250Hz  500Hz  1000Hz  2000Hz  3000Hz  4000Hz  6000Hz  8000Hz   Right ear:           Left ear:           Comments: Hearing aid, bilateral   Vision Screening Comments: Followed by Dr. Ellin Mayhew Wears corrective lenses Visual acuity not assessed, virtual visit.  They have seen their ophthalmologist in the last 12 months.    Dietary issues and exercise activities discussed: Current Exercise Habits: Home exercise routine, Type of exercise: walking, Intensity: Mild  Goals      Patient Stated     Eat a healthier diet (pt-stated)      Try to drink more water, less caffeine. (Currently drinks 11 cups of coffee daily, no water)      Depression Screen PHQ 2/9 Scores 10/26/2019 09/22/2019 03/24/2019 07/16/2018 07/12/2017 06/22/2016 06/23/2015  PHQ - 2 Score 0 0 0 0 0 0 0    Fall Risk Fall Risk  10/26/2019 09/22/2019 07/16/2018 07/12/2017 06/22/2016  Falls in the past year? 0 0 0 No No  Number falls in past yr: 0 0 - - -  Follow up Falls evaluation completed Falls evaluation  completed - - -   Handrails in use when climbing stairs? Yes  Home free of loose throw rugs in walkways, pet beds, electrical cords, etc? Yes  Adequate lighting in your home to reduce risk of falls? Yes   ASSISTIVE DEVICES UTILIZED TO PREVENT FALLS:  Use of a cane, walker or w/c? No  Grab bars in the bathroom? No  Shower chair or bench in shower? No  Elevated toilet seat or a handicapped toilet? No   TIMED UP AND GO:  Was the test performed? No . Virtual visit.   Cognitive Function: Patient is alert and oriented x3.  Plays solitaire for brain health.  MMSE - Mini Mental State Exam 06/22/2016 06/23/2015  Orientation to time 5 5  Orientation to Place 5 5  Registration 3 3  Attention/ Calculation 5 5  Recall 3 3  Language- name 2 objects 2 2  Language- repeat 1 1  Language- follow 3 step command 3 3  Language- read & follow direction 1 1  Write a sentence 1 1  Copy design 1 1  Total score 30 30     6CIT Screen 10/26/2019 07/16/2018 07/12/2017  What Year? 0 points 0 points 0 points  What month? 0 points 0 points 0 points  What time? 0 points 0 points 0 points  Count back from 20 - 0 points 0 points  Months in reverse - 0 points 0 points  Repeat phrase - 0 points 0 points  Total Score - 0 0    Immunizations Immunization History  Administered Date(s) Administered   Influenza Split 11/30/2010   Influenza, High Dose Seasonal PF 11/17/2016, 11/15/2017   Influenza,inj,Quad PF,6+ Mos 12/19/2012, 12/23/2013, 12/15/2014   Influenza-Unspecified 11/23/2015, 11/03/2017   PFIZER SARS-COV-2 Vaccination 04/09/2019, 04/30/2019   Pneumococcal Conjugate-13 06/23/2013   Pneumococcal Polysaccharide-23 12/06/2007    TDAP status: Due, Education has been provided regarding the importance of this vaccine. Advised may receive this vaccine at local pharmacy or Health Dept. Aware to provide a copy of the vaccination record if obtained from local pharmacy or Health Dept. Verbalized  acceptance and understanding.  Deferred.    Health Maintenance Health Maintenance  Topic Date Due   INFLUENZA VACCINE  01/26/2020 (Originally 10/04/2019)   TETANUS/TDAP  10/25/2020 (Originally 10/08/1959)   COVID-19 Vaccine  Completed   PNA vac Low Risk Adult  Completed    Dental Screening: Recommended annual dental exams for proper oral hygiene.  Community Resource Referral / Chronic Care Management: CRR required this visit?  No   CCM required this visit?  No      Plan:   Keep all routine maintenance appointments.   Follow up 03/22/20  I have personally reviewed and noted the following in the patients chart:    Medical and social history  Use of alcohol, tobacco or illicit drugs   Current medications and supplements  Functional ability and status  Nutritional status  Physical activity  Advanced directives  List of other physicians  Hospitalizations, surgeries, and ER visits in previous 12 months  Vitals  Screenings to include cognitive, depression, and falls  Referrals and appointments  In addition, I have reviewed and discussed with patient certain preventive protocols, quality metrics, and best practice recommendations. A written personalized care plan for preventive services as well as general preventive health recommendations were provided to patient via mychart.     Varney Biles, LPN   [id number]

## 2019-11-03 DIAGNOSIS — N1832 Chronic kidney disease, stage 3b: Secondary | ICD-10-CM | POA: Diagnosis not present

## 2019-11-03 DIAGNOSIS — I129 Hypertensive chronic kidney disease with stage 1 through stage 4 chronic kidney disease, or unspecified chronic kidney disease: Secondary | ICD-10-CM | POA: Diagnosis not present

## 2019-11-03 DIAGNOSIS — J449 Chronic obstructive pulmonary disease, unspecified: Secondary | ICD-10-CM | POA: Diagnosis not present

## 2019-11-03 DIAGNOSIS — E785 Hyperlipidemia, unspecified: Secondary | ICD-10-CM | POA: Diagnosis not present

## 2019-11-04 ENCOUNTER — Other Ambulatory Visit: Payer: Self-pay | Admitting: Nephrology

## 2019-11-04 DIAGNOSIS — N1832 Chronic kidney disease, stage 3b: Secondary | ICD-10-CM

## 2019-11-12 ENCOUNTER — Other Ambulatory Visit: Payer: Self-pay

## 2019-11-12 ENCOUNTER — Ambulatory Visit
Admission: RE | Admit: 2019-11-12 | Discharge: 2019-11-12 | Disposition: A | Discharge status: Home / Self Care | Payer: Medicare Other | Source: Ambulatory Visit | Attending: Nephrology | Admitting: Nephrology

## 2019-11-12 DIAGNOSIS — N1832 Chronic kidney disease, stage 3b: Secondary | ICD-10-CM | POA: Diagnosis not present

## 2019-11-13 ENCOUNTER — Ambulatory Visit: Payer: Medicare Other

## 2020-02-22 DIAGNOSIS — I129 Hypertensive chronic kidney disease with stage 1 through stage 4 chronic kidney disease, or unspecified chronic kidney disease: Secondary | ICD-10-CM | POA: Diagnosis not present

## 2020-02-22 DIAGNOSIS — E785 Hyperlipidemia, unspecified: Secondary | ICD-10-CM | POA: Diagnosis not present

## 2020-02-22 DIAGNOSIS — N2581 Secondary hyperparathyroidism of renal origin: Secondary | ICD-10-CM | POA: Diagnosis not present

## 2020-02-22 DIAGNOSIS — N1832 Chronic kidney disease, stage 3b: Secondary | ICD-10-CM | POA: Diagnosis not present

## 2020-02-22 DIAGNOSIS — N400 Enlarged prostate without lower urinary tract symptoms: Secondary | ICD-10-CM | POA: Diagnosis not present

## 2020-02-22 DIAGNOSIS — J449 Chronic obstructive pulmonary disease, unspecified: Secondary | ICD-10-CM | POA: Diagnosis not present

## 2020-03-11 ENCOUNTER — Other Ambulatory Visit: Payer: Self-pay | Admitting: Family Medicine

## 2020-03-11 ENCOUNTER — Other Ambulatory Visit: Payer: Self-pay | Admitting: Urology

## 2020-03-11 DIAGNOSIS — N401 Enlarged prostate with lower urinary tract symptoms: Secondary | ICD-10-CM

## 2020-03-18 ENCOUNTER — Other Ambulatory Visit: Payer: Self-pay

## 2020-03-22 ENCOUNTER — Other Ambulatory Visit: Payer: Self-pay

## 2020-03-22 ENCOUNTER — Encounter: Payer: Self-pay | Admitting: Family Medicine

## 2020-03-22 ENCOUNTER — Telehealth (INDEPENDENT_AMBULATORY_CARE_PROVIDER_SITE_OTHER): Payer: Medicare Other | Admitting: Family Medicine

## 2020-03-22 DIAGNOSIS — E785 Hyperlipidemia, unspecified: Secondary | ICD-10-CM

## 2020-03-22 DIAGNOSIS — N401 Enlarged prostate with lower urinary tract symptoms: Secondary | ICD-10-CM | POA: Diagnosis not present

## 2020-03-22 DIAGNOSIS — J309 Allergic rhinitis, unspecified: Secondary | ICD-10-CM

## 2020-03-22 DIAGNOSIS — I10 Essential (primary) hypertension: Secondary | ICD-10-CM

## 2020-03-22 DIAGNOSIS — R350 Frequency of micturition: Secondary | ICD-10-CM | POA: Diagnosis not present

## 2020-03-22 DIAGNOSIS — J449 Chronic obstructive pulmonary disease, unspecified: Secondary | ICD-10-CM | POA: Diagnosis not present

## 2020-03-22 MED ORDER — TAMSULOSIN HCL 0.4 MG PO CAPS: 0.4000 mg | ORAL_CAPSULE | Freq: Every day | ORAL | 3 refills | Status: DC

## 2020-03-22 MED ORDER — FLUTICASONE PROPIONATE 50 MCG/ACT NA SUSP: NASAL | 1 refills | Status: DC

## 2020-03-22 MED ORDER — SIMVASTATIN 20 MG PO TABS: 20.0000 mg | ORAL_TABLET | Freq: Every day | ORAL | 3 refills | Status: DC

## 2020-03-22 MED ORDER — LISINOPRIL 10 MG PO TABS: 5.0000 mg | ORAL_TABLET | Freq: Every day | ORAL | 3 refills | Status: DC

## 2020-03-22 MED ORDER — FINASTERIDE 5 MG PO TABS: 5.0000 mg | ORAL_TABLET | Freq: Every day | ORAL | 3 refills | Status: DC

## 2020-03-22 MED ORDER — MONTELUKAST SODIUM 10 MG PO TABS: 10.0000 mg | ORAL_TABLET | Freq: Every day | ORAL | 3 refills | Status: DC

## 2020-03-22 NOTE — Assessment & Plan Note (Signed)
Stable.  Continue Symbicort. 

## 2020-03-22 NOTE — Progress Notes (Signed)
Virtual Visit via telephone Note  This visit type was conducted due to national recommendations for restrictions regarding the COVID-19 pandemic (e.g. social distancing).  This format is felt to be most appropriate for this patient at this time.  All issues noted in this document were discussed and addressed.  No physical exam was performed (except for noted visual exam findings with Video Visits).   I connected with Lucas Black today at  8:00 AM EST by telephone and verified that I am speaking with the correct person using two identifiers. Location patient: home Location provider: home office Persons participating in the virtual visit: patient, provider  I discussed the limitations, risks, security and privacy concerns of performing an evaluation and management service by telephone and the availability of in person appointments. I also discussed with the patient that there may be a patient responsible charge related to this service. The patient expressed understanding and agreed to proceed.  Interactive audio and video telecommunications were attempted between this provider and patient, however failed, due to patient having technical difficulties OR patient did not have access to video capability.  We continued and completed visit with audio only.   Reason for visit: f/u  HPI: HYPERTENSION  Disease Monitoring  Home BP Monitoring 132/85 Chest pain- no    Dyspnea- no Medications  Compliance-  Taking lisinopril.  Edema- no  HYPERLIPIDEMIA Symptoms Chest pain on exertion:  no   Medications: Compliance- taking simvastatin Right upper quadrant pain- no  Muscle aches- no  COPD/allergic rhinitis: Medication compliance- taking symbicort  Rescue inhaler use- no Dyspnea- no  Wheezing- rare  Cough- no  Rhinorrhea- no           Sneezing- no     ROS: See pertinent positives and negatives per HPI.  Past Medical History:  Diagnosis Date  . COPD (chronic obstructive pulmonary disease)  ([place]) 05/30/2011  . COPD exacerbation ([place]) 07/08/2015  . Gastric ulcer 04/26/2011   1970's   . Hyperlipidemia   . Hypertension   . Prostatic enlargement 11/30/2010  . Screening for colon cancer 2011   colonoscopy normal  . Seasonal allergies 06/24/2014    Past Surgical History:  Procedure Laterality Date  . CATARACT EXTRACTION Left 2015  . GASTRIC RESECTION  1973   secondary to ulcer  . PROSTATE SURGERY      Family History  Problem Relation Age of Onset  . Cancer Mother        colon cancer  . Heart disease Father   . Heart attack Brother   . Colon cancer Brother   . Cancer Brother        skin    SOCIAL HX: former smoker   Current Outpatient Medications:  .  albuterol (PROVENTIL HFA;VENTOLIN HFA) 108 (90 Base) MCG/ACT inhaler, Inhale 2 puffs into the lungs every 6 (six) hours as needed for wheezing or shortness of breath., Disp: 3 Inhaler, Rfl: 1 .  aspirin 81 MG tablet, Take 81 mg by mouth daily., Disp: , Rfl:  .  budesonide-formoterol (SYMBICORT) 80-4.5 MCG/ACT inhaler, USE 2 INHALATIONS TWICE A DAY, Disp: 30.6 g, Rfl: 4 .  doxycycline (VIBRA-TABS) 100 MG tablet, Take 1 tablet (100 mg total) by mouth 2 (two) times daily., Disp: 14 tablet, Rfl: 0 .  finasteride (PROSCAR) 5 MG tablet, Take 1 tablet (5 mg total) by mouth daily., Disp: 90 tablet, Rfl: 3 .  fluticasone (FLONASE) 50 MCG/ACT nasal spray, SHAKE LIQUID AND USE 2 SPRAYS IN EACH NOSTRIL DAILY, Disp: 48 g,  Rfl: 1 .  lisinopril (ZESTRIL) 10 MG tablet, Take 0.5 tablets (5 mg total) by mouth daily., Disp: 45 tablet, Rfl: 3 .  montelukast (SINGULAIR) 10 MG tablet, Take 1 tablet (10 mg total) by mouth at bedtime., Disp: 90 tablet, Rfl: 3 .  simvastatin (ZOCOR) 20 MG tablet, Take 1 tablet (20 mg total) by mouth at bedtime., Disp: 90 tablet, Rfl: 3 .  tamsulosin (FLOMAX) 0.4 MG CAPS capsule, Take 1 capsule (0.4 mg total) by mouth daily., Disp: 90 capsule, Rfl: 3  EXAM: Telephone visit and thus no physical exam was  completed.  ASSESSMENT AND PLAN:  Discussed the following assessment and plan:  Problem List Items Addressed This Visit    Allergic rhinitis    Stable.  Continue Flonase and Singulair.      Relevant Medications   fluticasone (FLONASE) 50 MCG/ACT nasal spray   montelukast (SINGULAIR) 10 MG tablet   Benign prostatic hyperplasia with urinary frequency   Relevant Medications   finasteride (PROSCAR) 5 MG tablet   tamsulosin (FLOMAX) 0.4 MG CAPS capsule   COPD (chronic obstructive pulmonary disease) ([facility]) (Chronic)    Stable.  Continue Symbicort.      Relevant Medications   fluticasone (FLONASE) 50 MCG/ACT nasal spray   montelukast (SINGULAIR) 10 MG tablet   Hyperlipidemia (Chronic)    Adequate control.  Continue simvastatin 20 mg once daily.      Relevant Medications   lisinopril (ZESTRIL) 10 MG tablet   simvastatin (ZOCOR) 20 MG tablet   Hypertension (Chronic)    Adequate control for age.  Discussed goal of less than 140/90.  He will continue lisinopril 5 mg once daily.  Check BMP.      Relevant Medications   lisinopril (ZESTRIL) 10 MG tablet   simvastatin (ZOCOR) 20 MG tablet   Other Relevant Orders   Basic Metabolic Panel (BMET)       I discussed the assessment and treatment plan with the patient. The patient was provided an opportunity to ask questions and all were answered. The patient agreed with the plan and demonstrated an understanding of the instructions.   The patient was advised to call back or seek an in-person evaluation if the symptoms worsen or if the condition fails to improve as anticipated.  I provided 9 minutes of non-face-to-face time during this encounter.   Tommi Rumps, MD

## 2020-03-22 NOTE — Assessment & Plan Note (Signed)
Stable.  Continue Flonase and Singulair.

## 2020-03-22 NOTE — Assessment & Plan Note (Signed)
Adequate control.  Continue simvastatin 20 mg once daily.

## 2020-03-22 NOTE — Assessment & Plan Note (Signed)
Adequate control for age.  Discussed goal of less than 140/90.  He will continue lisinopril 5 mg once daily.  Check BMP.

## 2020-04-01 ENCOUNTER — Other Ambulatory Visit: Payer: Self-pay

## 2020-04-01 ENCOUNTER — Other Ambulatory Visit (INDEPENDENT_AMBULATORY_CARE_PROVIDER_SITE_OTHER): Payer: Medicare Other

## 2020-04-01 DIAGNOSIS — I10 Essential (primary) hypertension: Secondary | ICD-10-CM

## 2020-04-01 LAB — BASIC METABOLIC PANEL
BUN: 25 mg/dL — ABNORMAL HIGH (ref 6–23)
CO2: 29 mEq/L (ref 19–32)
Calcium: 10.4 mg/dL (ref 8.4–10.5)
Chloride: 105 mEq/L (ref 96–112)
Creatinine, Ser: 1.58 mg/dL — ABNORMAL HIGH (ref 0.40–1.50)
GFR: 41.06 mL/min — ABNORMAL LOW (ref >60.00)
Glucose, Bld: 99 mg/dL (ref 70–99)
Potassium: 4.7 mEq/L (ref 3.5–5.1)
Sodium: 138 mEq/L (ref 135–145)

## 2020-04-06 ENCOUNTER — Other Ambulatory Visit: Payer: Self-pay | Admitting: Family Medicine

## 2020-04-26 DIAGNOSIS — H353131 Nonexudative age-related macular degeneration, bilateral, early dry stage: Secondary | ICD-10-CM | POA: Diagnosis not present

## 2020-04-26 DIAGNOSIS — H26493 Other secondary cataract, bilateral: Secondary | ICD-10-CM | POA: Diagnosis not present

## 2020-04-26 DIAGNOSIS — H02889 Meibomian gland dysfunction of unspecified eye, unspecified eyelid: Secondary | ICD-10-CM | POA: Diagnosis not present

## 2020-05-16 ENCOUNTER — Ambulatory Visit: Payer: Self-pay | Admitting: Urology

## 2020-05-19 ENCOUNTER — Ambulatory Visit (INDEPENDENT_AMBULATORY_CARE_PROVIDER_SITE_OTHER): Payer: Medicare Other | Admitting: Urology

## 2020-05-19 ENCOUNTER — Encounter: Payer: Self-pay | Admitting: Urology

## 2020-05-19 ENCOUNTER — Other Ambulatory Visit: Payer: Self-pay

## 2020-05-19 VITALS — BP 146/87 | HR 90 | Ht 68.0 in | Wt 180.0 lb

## 2020-05-19 DIAGNOSIS — R350 Frequency of micturition: Secondary | ICD-10-CM | POA: Diagnosis not present

## 2020-05-19 DIAGNOSIS — N401 Enlarged prostate with lower urinary tract symptoms: Secondary | ICD-10-CM

## 2020-05-19 LAB — MICROSCOPIC EXAMINATION: Bacteria, UA: NONE SEEN

## 2020-05-19 LAB — URINALYSIS, COMPLETE
Bilirubin, UA: NEGATIVE
Glucose, UA: NEGATIVE
Ketones, UA: NEGATIVE
Leukocytes,UA: NEGATIVE
Nitrite, UA: NEGATIVE
Protein,UA: NEGATIVE
RBC, UA: NEGATIVE
Specific Gravity, UA: 1.01 (ref 1.005–1.030)
Urobilinogen, Ur: 0.2 mg/dL (ref 0.2–1.0)
pH, UA: 6.5 (ref 5.0–7.5)

## 2020-05-19 LAB — BLADDER SCAN AMB NON-IMAGING: Scan Result: 71

## 2020-05-19 MED ORDER — FINASTERIDE 5 MG PO TABS: 5.0000 mg | ORAL_TABLET | Freq: Every day | ORAL | 3 refills | Status: DC

## 2020-05-19 MED ORDER — TAMSULOSIN HCL 0.4 MG PO CAPS: 0.4000 mg | ORAL_CAPSULE | Freq: Every day | ORAL | 3 refills | Status: DC

## 2020-05-19 NOTE — Progress Notes (Signed)
05/19/2020 10:28 AM   [name] 11-Jul-1940 [id number]  Referring provider: Leone Haven, MD [address] [place],  [place] [address]  Chief Complaint  Patient presents with  . Benign Prostatic Hypertrophy    Urologic history: 1.BPH with lower urinary tract symptoms -Outlet procedure 2004 for BPH; records not available -Recurrent symptoms around 2014 and started on combination therapy -Initially seen here 02/2017   HPI: 80 y.o. male presents for annual follow-up.   No problems since last years visit  Stable LUTS on tamsulosin/finasteride  Denies dysuria, gross hematuria  Denies flank, abdominal or pelvic pain  IPSS 12/35 with QoL rated 1/6   PMH: Past Medical History:  Diagnosis Date  . COPD (chronic obstructive pulmonary disease) ([facility]) 05/30/2011  . COPD exacerbation ([facility]) 07/08/2015  . Gastric ulcer 04/26/2011   1970's   . Hyperlipidemia   . Hypertension   . Prostatic enlargement 11/30/2010  . Screening for colon cancer 2011   colonoscopy normal  . Seasonal allergies 06/24/2014    Surgical History: Past Surgical History:  Procedure Laterality Date  . CATARACT EXTRACTION Left 2015  . GASTRIC RESECTION  1973   secondary to ulcer  . PROSTATE SURGERY      Home Medications:  Allergies as of 05/19/2020   No Known Allergies     Medication List       Accurate as of May 19, 2020 10:28 AM. If you have any questions, ask your nurse or doctor.        STOP taking these medications   doxycycline 100 MG tablet Commonly known as: VIBRA-TABS Stopped by: [name], MD     TAKE these medications   albuterol 108 (90 Base) MCG/ACT inhaler Commonly known as: VENTOLIN HFA Inhale 2 puffs into the lungs every 6 (six) hours as needed for wheezing or shortness of breath.   aspirin 81 MG tablet Take 81 mg by mouth daily.   budesonide-formoterol 80-4.5 MCG/ACT inhaler Commonly known as: Symbicort USE 2 INHALATIONS TWICE A DAY    finasteride 5 MG tablet Commonly known as: PROSCAR Take 1 tablet (5 mg total) by mouth daily.   fluticasone 50 MCG/ACT nasal spray Commonly known as: FLONASE SHAKE LIQUID AND USE 2 SPRAYS IN EACH NOSTRIL DAILY   lisinopril 10 MG tablet Commonly known as: ZESTRIL Take 0.5 tablets (5 mg total) by mouth daily.   montelukast 10 MG tablet Commonly known as: SINGULAIR Take 1 tablet (10 mg total) by mouth at bedtime.   simvastatin 20 MG tablet Commonly known as: ZOCOR Take 1 tablet (20 mg total) by mouth at bedtime.   tamsulosin 0.4 MG Caps capsule Commonly known as: FLOMAX Take 1 capsule (0.4 mg total) by mouth daily.       Allergies: No Known Allergies  Family History: Family History  Problem Relation Age of Onset  . Cancer Mother        colon cancer  . Heart disease Father   . Heart attack Brother   . Colon cancer Brother   . Cancer Brother        skin    Social History:  reports that he quit smoking about 36 years ago. He has never used smokeless tobacco. He reports current alcohol use. He reports that he does not use drugs.   Physical Exam: There were no vitals taken for this visit.  Constitutional:  Alert and oriented, No acute distress. HEENT: [place] AT, moist mucus membranes.  Trachea midline, no masses. Cardiovascular: No  clubbing, cyanosis, or edema. Respiratory: Normal respiratory effort, no increased work of breathing.   Assessment & Plan:    1. Benign prostatic hyperplasia with urinary frequency  Stable voiding symptoms on tamsulosin/finasteride  Refills sent to pharmacy  Bladder scan PVR 71 mL  Continue annual follow-up  [name], MD  [facility] [address] [place], [place] [address] [phone]

## 2020-06-08 DIAGNOSIS — E785 Hyperlipidemia, unspecified: Secondary | ICD-10-CM | POA: Diagnosis not present

## 2020-06-08 DIAGNOSIS — D631 Anemia in chronic kidney disease: Secondary | ICD-10-CM | POA: Diagnosis not present

## 2020-06-08 DIAGNOSIS — N2581 Secondary hyperparathyroidism of renal origin: Secondary | ICD-10-CM | POA: Diagnosis not present

## 2020-06-08 DIAGNOSIS — N183 Chronic kidney disease, stage 3 unspecified: Secondary | ICD-10-CM | POA: Diagnosis not present

## 2020-06-08 DIAGNOSIS — N1832 Chronic kidney disease, stage 3b: Secondary | ICD-10-CM | POA: Diagnosis not present

## 2020-06-08 DIAGNOSIS — I129 Hypertensive chronic kidney disease with stage 1 through stage 4 chronic kidney disease, or unspecified chronic kidney disease: Secondary | ICD-10-CM | POA: Diagnosis not present

## 2020-06-08 DIAGNOSIS — D509 Iron deficiency anemia, unspecified: Secondary | ICD-10-CM | POA: Diagnosis not present

## 2020-06-08 DIAGNOSIS — N400 Enlarged prostate without lower urinary tract symptoms: Secondary | ICD-10-CM | POA: Diagnosis not present

## 2020-06-25 ENCOUNTER — Other Ambulatory Visit: Payer: Self-pay | Admitting: Family Medicine

## 2020-06-25 DIAGNOSIS — I10 Essential (primary) hypertension: Secondary | ICD-10-CM

## 2020-06-25 DIAGNOSIS — E785 Hyperlipidemia, unspecified: Secondary | ICD-10-CM

## 2020-09-28 ENCOUNTER — Other Ambulatory Visit: Payer: Self-pay

## 2020-09-28 ENCOUNTER — Ambulatory Visit (INDEPENDENT_AMBULATORY_CARE_PROVIDER_SITE_OTHER): Payer: Medicare Other | Admitting: Family Medicine

## 2020-09-28 DIAGNOSIS — E785 Hyperlipidemia, unspecified: Secondary | ICD-10-CM | POA: Diagnosis not present

## 2020-09-28 DIAGNOSIS — I10 Essential (primary) hypertension: Secondary | ICD-10-CM | POA: Diagnosis not present

## 2020-09-28 DIAGNOSIS — J449 Chronic obstructive pulmonary disease, unspecified: Secondary | ICD-10-CM | POA: Diagnosis not present

## 2020-09-28 LAB — COMPREHENSIVE METABOLIC PANEL
ALT: 16 U/L (ref 0–53)
AST: 19 U/L (ref 0–37)
Albumin: 4.5 g/dL (ref 3.5–5.2)
Alkaline Phosphatase: 76 U/L (ref 39–117)
BUN: 27 mg/dL — ABNORMAL HIGH (ref 6–23)
CO2: 25 mEq/L (ref 19–32)
Calcium: 10.1 mg/dL (ref 8.4–10.5)
Chloride: 105 mEq/L (ref 96–112)
Creatinine, Ser: 1.51 mg/dL — ABNORMAL HIGH (ref 0.40–1.50)
GFR: 43.21 mL/min — ABNORMAL LOW (ref >60.00)
Glucose, Bld: 95 mg/dL (ref 70–99)
Potassium: 4.5 mEq/L (ref 3.5–5.1)
Sodium: 137 mEq/L (ref 135–145)
Total Bilirubin: 0.6 mg/dL (ref 0.2–1.2)
Total Protein: 6.8 g/dL (ref 6.0–8.3)

## 2020-09-28 LAB — LIPID PANEL
Cholesterol: 151 mg/dL (ref 0–200)
HDL: 51.5 mg/dL (ref >39.00)
LDL Cholesterol: 82 mg/dL (ref 0–99)
NonHDL: 99.54
Total CHOL/HDL Ratio: 3
Triglycerides: 87 mg/dL (ref 0.0–149.0)
VLDL: 17.4 mg/dL (ref 0.0–40.0)

## 2020-09-28 NOTE — Assessment & Plan Note (Signed)
Stable.  Well-controlled.  He will continue Symbicort 2 puffs twice daily.

## 2020-09-28 NOTE — Progress Notes (Signed)
Tommi Rumps, MD Phone: [phone]  Lucas Black is a 80 y.o. male who presents today for f/u.  HYPERTENSION Disease Monitoring Home BP Monitoring 329-924 systolic Chest pain- no    Dyspnea- no Medications Compliance-  taking lisinopril 10 mg daily  Edema- no  HYPERLIPIDEMIA Symptoms Chest pain on exertion:  no   Medications: Compliance- taking simvastatin 20 mg daily Right upper quadrant pain- no  Muscle aches- no  COPD: Medication compliance- using symbicort 2 pufs BID most days, occasionally misses a day  Rescue inhaler use- no Dyspnea- no  Wheezing- no  Cough- no    Social History   Tobacco Use  Smoking Status Former   Types: Cigarettes   Quit date: 03/31/1984   Years since quitting: 36.5  Smokeless Tobacco Never    Current Outpatient Medications on File Prior to Visit  Medication Sig Dispense Refill   aspirin 81 MG tablet Take 81 mg by mouth daily.     budesonide-formoterol (SYMBICORT) 80-4.5 MCG/ACT inhaler USE 2 INHALATIONS TWICE A DAY 30.6 g 3   finasteride (PROSCAR) 5 MG tablet Take 1 tablet (5 mg total) by mouth daily. 90 tablet 3   lisinopril (ZESTRIL) 10 MG tablet TAKE ONE-HALF (1/2) TABLET DAILY 45 tablet 3   montelukast (SINGULAIR) 10 MG tablet Take 1 tablet (10 mg total) by mouth at bedtime. 90 tablet 3   simvastatin (ZOCOR) 20 MG tablet TAKE 1 TABLET AT BEDTIME 90 tablet 3   tamsulosin (FLOMAX) 0.4 MG CAPS capsule Take 1 capsule (0.4 mg total) by mouth daily. 90 capsule 3   fluticasone (FLONASE) 50 MCG/ACT nasal spray SHAKE LIQUID AND USE 2 SPRAYS IN EACH NOSTRIL DAILY (Patient not taking: Reported on 09/28/2020) 48 g 1   No current facility-administered medications on file prior to visit.     ROS see history of present illness  Objective  Physical Exam Vitals:   09/28/20 0809  BP: 118/80  Pulse: 61  Temp: 98.4 F (36.9 C)  SpO2: 95%    BP Readings from Last 3 Encounters:  09/28/20 118/80  05/19/20 (!) 146/87  09/22/19 140/80   Wt  Readings from Last 3 Encounters:  09/28/20 185 lb (83.9 kg)  05/19/20 180 lb (81.6 kg)  03/22/20 180 lb (81.6 kg)    Physical Exam Constitutional:      General: He is not in acute distress.    Appearance: He is not diaphoretic.  Cardiovascular:     Rate and Rhythm: Normal rate and regular rhythm.     Heart sounds: Normal heart sounds.  Pulmonary:     Effort: Pulmonary effort is normal.     Breath sounds: Normal breath sounds.  Musculoskeletal:     Right lower leg: No edema.     Left lower leg: No edema.  Skin:    General: Skin is warm and dry.  Neurological:     Mental Status: He is alert.     Assessment/Plan: Please see individual problem list.  Problem List Items Addressed This Visit     COPD (chronic obstructive pulmonary disease) ([facility]) (Chronic)    Stable.  Well-controlled.  He will continue Symbicort 2 puffs twice daily.       Hyperlipidemia (Chronic)    Continue simvastatin 20 mg daily.  Check lipid panel.       Relevant Orders   Comp Met (CMET)   Lipid panel   Hypertension (Chronic)    Adequately controlled.  He will continue lisinopril.  We will check a CMP today.  Relevant Orders   Comp Met (CMET)     Health Maintenance: patient declines shingrix vaccine.   Return in about 6 months (around 03/31/2021) for Hypertension with labs.  This visit occurred during the SARS-CoV-2 public health emergency.  Safety protocols were in place, including screening questions prior to the visit, additional usage of staff PPE, and extensive cleaning of exam room while observing appropriate contact time as indicated for disinfecting solutions.    Tommi Rumps, MD [facility]

## 2020-09-28 NOTE — Assessment & Plan Note (Signed)
Adequately controlled.  He will continue lisinopril.  We will check a CMP today.

## 2020-09-28 NOTE — Patient Instructions (Signed)
Nice to see you. We will check lab work today and contact you with the results. 

## 2020-09-28 NOTE — Assessment & Plan Note (Signed)
Continue simvastatin 20 mg daily.  Check lipid panel.

## 2020-10-19 DIAGNOSIS — E785 Hyperlipidemia, unspecified: Secondary | ICD-10-CM | POA: Diagnosis not present

## 2020-10-19 DIAGNOSIS — I129 Hypertensive chronic kidney disease with stage 1 through stage 4 chronic kidney disease, or unspecified chronic kidney disease: Secondary | ICD-10-CM | POA: Diagnosis not present

## 2020-10-19 DIAGNOSIS — J449 Chronic obstructive pulmonary disease, unspecified: Secondary | ICD-10-CM | POA: Diagnosis not present

## 2020-10-19 DIAGNOSIS — N1832 Chronic kidney disease, stage 3b: Secondary | ICD-10-CM | POA: Diagnosis not present

## 2020-10-26 ENCOUNTER — Ambulatory Visit (INDEPENDENT_AMBULATORY_CARE_PROVIDER_SITE_OTHER): Payer: Medicare Other

## 2020-10-26 VITALS — Ht 68.0 in | Wt 185.0 lb

## 2020-10-26 DIAGNOSIS — Z0000 Encounter for general adult medical examination without abnormal findings: Secondary | ICD-10-CM | POA: Diagnosis not present

## 2020-10-26 NOTE — Progress Notes (Signed)
Subjective:   Lucas Black is a 80 y.o. male who presents for Medicare Annual/Subsequent preventive examination.  Review of Systems    No ROS.  Medicare Wellness Virtual Visit.  Visual/audio telehealth visit, UTA vital signs.   See social history for additional risk factors.   Cardiac Risk Factors include: advanced age (>52mn, >>110women);male gender     Objective:    Today's Vitals   10/26/20 1407  Weight: 185 lb (83.9 kg)  Height: '5\' 8"'$  (1.727 m)   Body mass index is 28.13 kg/m.  Advanced Directives 10/26/2020 10/26/2019 07/16/2018 07/12/2017 06/22/2016 06/23/2015  Does Patient Have a Medical Advance Directive? Yes No Yes No No No  Type of [place] Living will;Healthcare Power of Attorney - -  Does patient want to make changes to medical advance directive? No - Patient declined - No - Patient declined Yes (MAU/Ambulatory/Procedural Areas - Information given) - -  Copy of [place] Chart? Yes - validated most recent copy scanned in chart (See row information) - Yes - validated most recent copy scanned in chart (See row information) - - -  Would patient like information on creating a medical advance directive? - Yes (MAU/Ambulatory/Procedural Areas - Information given) - - No - Patient declined Yes - Educational materials given    Current Medications (verified) Outpatient Encounter Medications as of 10/26/2020  Medication Sig   aspirin 81 MG tablet Take 81 mg by mouth daily.   budesonide-formoterol (SYMBICORT) 80-4.5 MCG/ACT inhaler USE 2 INHALATIONS TWICE A DAY   finasteride (PROSCAR) 5 MG tablet Take 1 tablet (5 mg total) by mouth daily.   fluticasone (FLONASE) 50 MCG/ACT nasal spray SHAKE LIQUID AND USE 2 SPRAYS IN EACH NOSTRIL DAILY (Patient not taking: Reported on 09/28/2020)   lisinopril (ZESTRIL) 10 MG tablet TAKE ONE-HALF (1/2) TABLET DAILY   montelukast  (SINGULAIR) 10 MG tablet Take 1 tablet (10 mg total) by mouth at bedtime.   simvastatin (ZOCOR) 20 MG tablet TAKE 1 TABLET AT BEDTIME   tamsulosin (FLOMAX) 0.4 MG CAPS capsule Take 1 capsule (0.4 mg total) by mouth daily.   No facility-administered encounter medications on file as of 10/26/2020.    Allergies (verified) Patient has no known allergies.   History: Past Medical History:  Diagnosis Date   COPD [place]ch[place] obstructive pulmonary disease) [place] 05/[place]   [place]D exacerb[place] 07/[place]/2015   Gastric ulcer 04/26/2011   1970's    Hyperlipidemia    Hypertension    Prostatic enlargement 11/30/2010   Screening for colon cancer 2011   colonoscopy normal   Seasonal allergies 06/24/2014   Past Surgical History:  Procedure Laterality Date   CATARACT EXTRACTION Left 2015   GASTRIC RESECTION  1973   secondary to ulcer   PROSTATE SURGERY     Family History  Problem Relation Age of Onset   Cancer Mother        colon cancer   Heart disease Father    Heart attack Brother    Colon cancer Brother    Cancer Brother        skin   Social History   Socioeconomic History   Marital status: Married    Spouse name: Not on file   Number of children: Not on file   Years of education: Not on file   Highest education level: Not on file  Occupational History    Employer: retired  Tobacco Use  Smoking status: Former    Types: Cigarettes    Quit date: 03/31/1984    Years since quitting: 36.5   Smokeless tobacco: Never  Vaping Use   Vaping Use: Never used  Substance and Sexual Activity   Alcohol use: Yes    Comment: rarely   Drug use: No   Sexual activity: Yes  Other Topics Concern   Not on file  Social History Narrative   Not on file   Social Determinants of Health   Financial Resource Strain: Low Risk    Difficulty of Paying Living Expenses: Not hard at all  Food Insecurity: No Food Insecurity   Worried About Charity fundraiser in the Last Year: Never true   [place] in the Last Year: Never true  Transportation Needs: No Transportation Needs   Lack of Transportation (Medical): No   Lack of Transportation (Non-Medical): No  Physical Activity: Not on file  Stress: No Stress Concern Present   Feeling of Stress : Not at all  Social Connections: Unknown   Frequency of Communication with Friends and Family: Not on file   Frequency of Social Gatherings with Friends and Family: Not on file   Attends Religious Services: Not on file   Active Member of Clubs or Organizations: Not on file   Attends Archivist Meetings: Not on file   Marital Status: Married    Tobacco Counseling Counseling given: Not Answered   Clinical Intake:  Pre-visit preparation completed: Yes        Diabetes: No  How often do you need to have someone help you when you read instructions, pamphlets, or other written materials from your doctor or pharmacy?: 1 - Never    Interpreter Needed?: No      Activities of Daily Living In your present state of health, do you have any difficulty performing the following activities: 10/26/2020  Hearing? Y  Comment Hearing aids  Vision? N  Difficulty concentrating or making decisions? N  Walking or climbing stairs? N  Dressing or bathing? N  Doing errands, shopping? N  Preparing Food and eating ? N  Using the Toilet? N  In the past six months, have you accidently leaked urine? N  Comment Followed by Urology  Do you have problems with loss of bowel control? N  Managing your Medications? N  Managing your Finances? N  Housekeeping or managing your Housekeeping? N  Some recent data might be hidden    Patient Care Team: Leone Haven, MD as PCP - General (Family Medicine)  Indicate any recent Medical Services you may have received from other than [facility] providers in the past year (date may be approximate).     Assessment:   This is a routine wellness examination for Koda.  I connected with Clester today by  telephone and verified that I am speaking with the correct person using two identifiers. Location patient: home Location provider: work Persons participating in the virtual visit: patient, Marine scientist.    I discussed the limitations, risks, security and privacy concerns of performing an evaluation and management service by telephone and the availability of in person appointments. The patient expressed understanding and verbally consented to this telephonic visit.    Interactive audio and video telecommunications were attempted between this provider and patient, however failed, due to patient having technical difficulties OR patient did not have access to video capability.  We continued and completed visit with audio only.  Some vital signs may be absent  or patient reported.   Hearing/Vision screen Hearing Screening - Comments:: Hearing aids Vision Screening - Comments:: Wears corrective lenses  They have seen their ophthalmologist in the last 12 months.    Dietary issues and exercise activities discussed: Current Exercise Habits: Home exercise routine, Intensity: Mild Regular diet Good fluid intake   Goals Addressed               This Visit's Progress     Patient Stated     Eat a healthier diet (pt-stated)        Stay hydrated       Depression Screen PHQ 2/9 Scores 10/26/2020 10/26/2019 09/22/2019 03/24/2019 07/16/2018 07/12/2017 06/22/2016  PHQ - 2 Score 0 0 0 0 0 0 0    Fall Risk Fall Risk  10/26/2020 10/26/2019 09/22/2019 07/16/2018 07/12/2017  Falls in the past year? 0 0 0 0 No  Number falls in past yr: - 0 0 - -  Follow up Falls evaluation completed Falls evaluation completed Falls evaluation completed - -    FALL RISK PREVENTION PERTAINING TO THE HOME: Adequate lighting in your home to reduce risk of falls? Yes   ASSISTIVE DEVICES UTILIZED TO PREVENT FALLS: Life alert? No  Use of a cane, walker or w/c? No   TIMED UP AND GO: Was the test performed? No .   Cognitive  Function: MMSE - Mini Mental State Exam 06/22/2016 06/23/2015  Orientation to time 5 5  Orientation to Place 5 5  Registration 3 3  Attention/ Calculation 5 5  Recall 3 3  Language- name 2 objects 2 2  Language- repeat 1 1  Language- follow 3 step command 3 3  Language- read & follow direction 1 1  Write a sentence 1 1  Copy design 1 1  Total score 30 30     6CIT Screen 10/26/2020 10/26/2019 07/16/2018 07/12/2017  What Year? 0 points 0 points 0 points 0 points  What month? 0 points 0 points 0 points 0 points  What time? 0 points 0 points 0 points 0 points  Count back from 20 0 points - 0 points 0 points  Months in reverse 0 points - 0 points 0 points  Repeat phrase - - 0 points 0 points  Total Score - - 0 0    Immunizations Immunization History  Administered Date(s) Administered   Influenza Split 11/30/2010   Influenza, High Dose Seasonal PF 11/17/2016, 11/15/2017   Influenza,inj,Quad PF,6+ Mos 12/19/2012, 12/23/2013, 12/15/2014   Influenza-Unspecified 11/23/2015, 11/03/2017   PFIZER(Purple Top)SARS-COV-2 Vaccination 04/09/2019, 04/30/2019, 12/16/2019, 07/25/2020   Pneumococcal Conjugate-13 06/23/2013   Pneumococcal Polysaccharide-23 12/06/2007    TDAP status: Due, Education has been provided regarding the importance of this vaccine. Advised may receive this vaccine at local pharmacy or Health Dept. Aware to provide a copy of the vaccination record if obtained from local pharmacy or Health Dept. Verbalized acceptance and understanding. Deferred.   Shingrix vaccine- Due, Education has been provided regarding the importance of this vaccine. Advised may receive this vaccine at local pharmacy or Health Dept. Aware to provide a copy of the vaccination record if obtained from local pharmacy or Health Dept. Verbalized acceptance and understanding. Deferred.   Health Maintenance Health Maintenance  Topic Date Due   INFLUENZA VACCINE  12/12/2020 (Originally 10/03/2020)   Zoster Vaccines-  Shingrix (1 of 2) 01/26/2021 (Originally 10/07/1989)   TETANUS/TDAP  10/26/2021 (Originally 10/08/1959)   COVID-19 Vaccine  Completed   PNA vac Low Risk Adult  Completed  HPV VACCINES  Aged Out   Hepatitis C Screening: does not qualify  Vision Screening: Recommended annual ophthalmology exams for early detection of glaucoma and other disorders of the eye.  Dental Screening: Recommended annual dental exams for proper oral hygiene. Dentures.   Community Resource Referral / Chronic Care Management: CRR required this visit?  No   CCM required this visit?  No      Plan:   Keep all routine maintenance appointments.   I have personally reviewed and noted the following in the patient's chart:   Medical and social history Use of alcohol, tobacco or illicit drugs  Current medications and supplements including opioid prescriptions. Patient is not currently taking opioid prescriptions. Functional ability and status Nutritional status Physical activity Advanced directives List of other physicians Hospitalizations, surgeries, and ER visits in previous 12 months Vitals Screenings to include cognitive, depression, and falls Referrals and appointments  In addition, I have reviewed and discussed with patient certain preventive protocols, quality metrics, and best practice recommendations. A written personalized care plan for preventive services as well as general preventive health recommendations were provided to patient via mychart.     Varney Biles, LPN   [id number]

## 2020-10-26 NOTE — Patient Instructions (Addendum)
  [name] , Thank you for taking time to come for your Medicare Wellness Visit. I appreciate your ongoing commitment to your health goals. Please review the following plan we discussed and let me know if I can assist you in the future.   These are the goals we discussed:  Goals       Patient Stated     Eat a healthier diet (pt-stated)      Stay hydrated        This is a list of the screening recommended for you and due dates:  Health Maintenance  Topic Date Due   Flu Shot  12/12/2020*   Zoster (Shingles) Vaccine (1 of 2) 01/26/2021*   Tetanus Vaccine  10/26/2021*   COVID-19 Vaccine  Completed   Pneumonia vaccines  Completed   HPV Vaccine  Aged Out  *Topic was postponed. The date shown is not the original due date.

## 2020-10-30 IMAGING — US US RENAL
1 series · 14 of 25 positions shown · non-contrast
Comparison: None.

CLINICAL DATA: Chronic kidney disease stage 3 B.

EXAM:
RENAL / URINARY TRACT ULTRASOUND COMPLETE

[Series 1: us renal · 0.26mm/px · 14 of 39 slices shown]
[im 1/39]
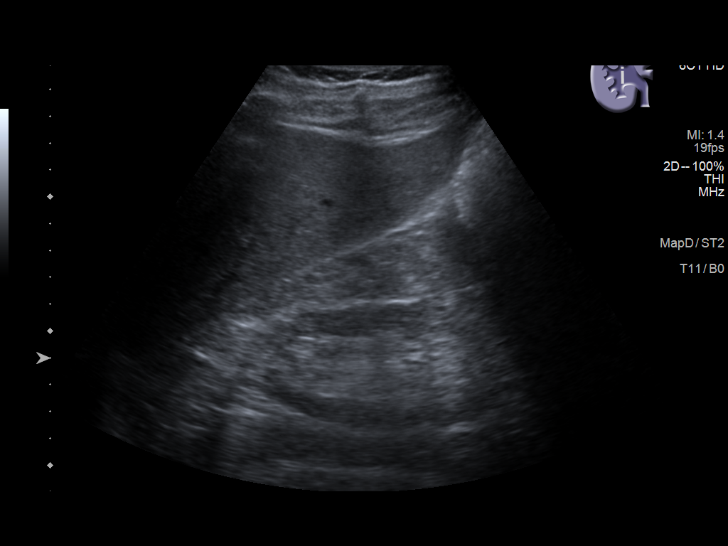
[im 4/39]
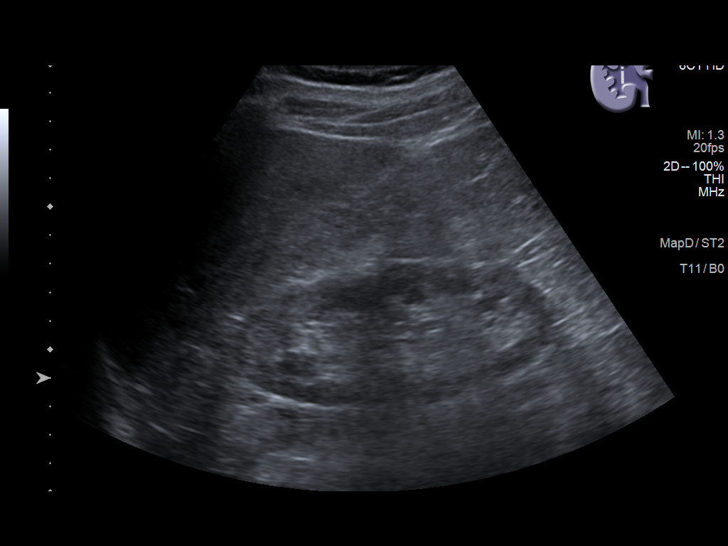
[im 7/39]
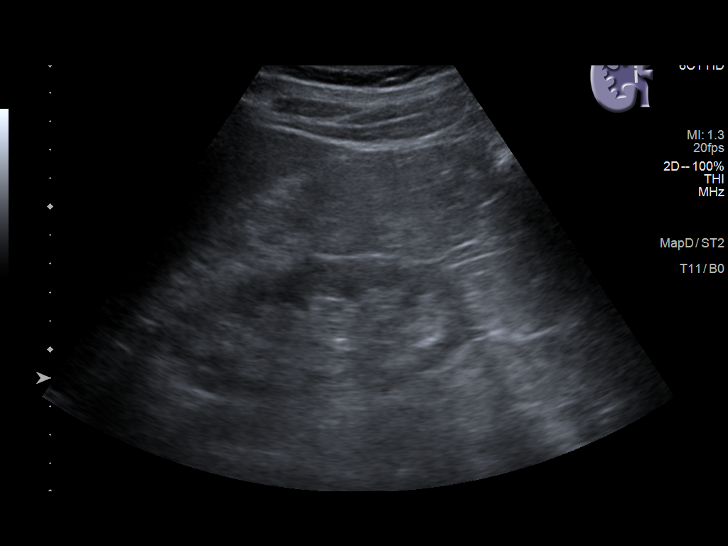
[im 10/39]
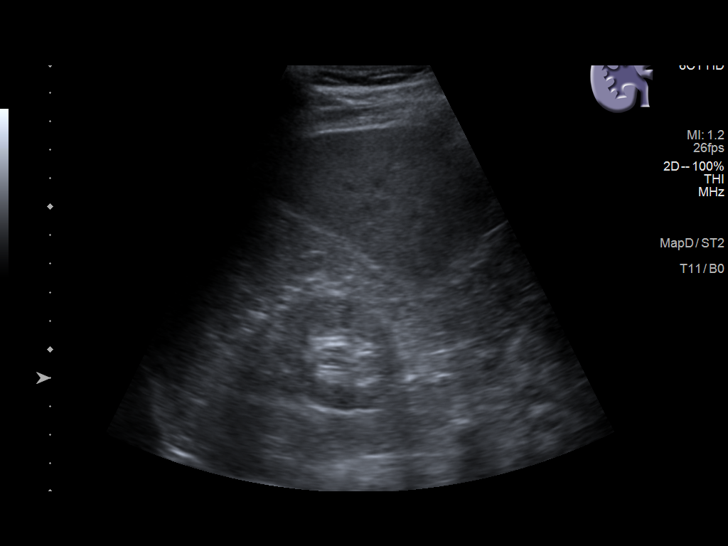
[im 13/39]
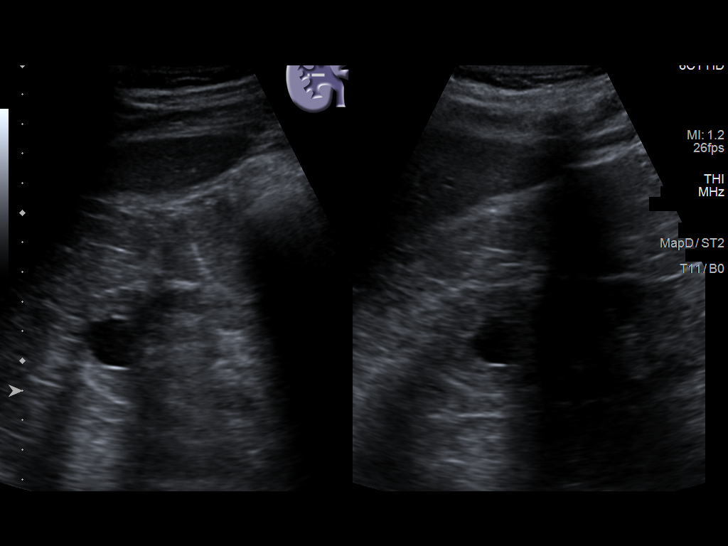
[im 15/39]
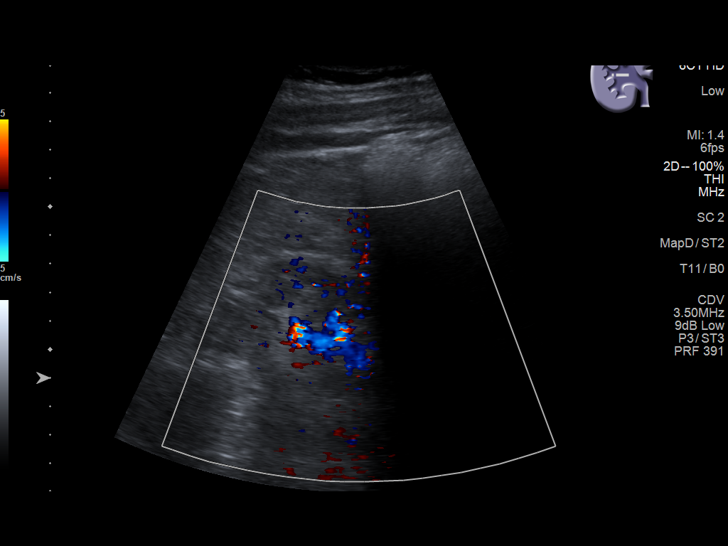
[im 18/39]
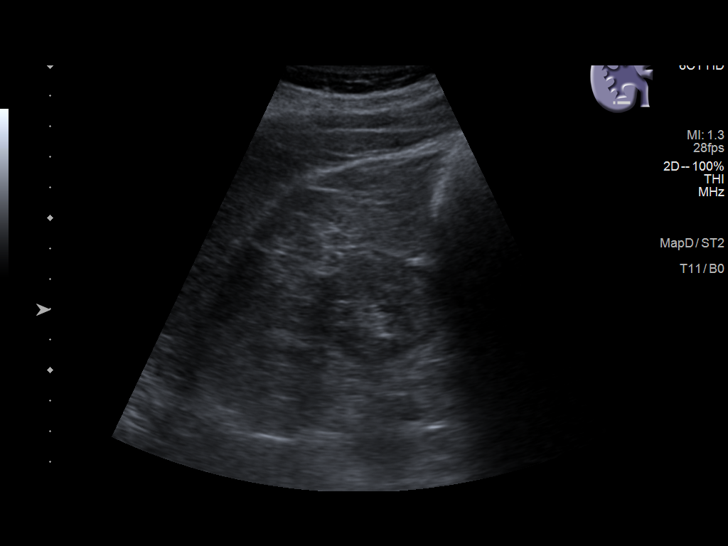
[im 21/39]
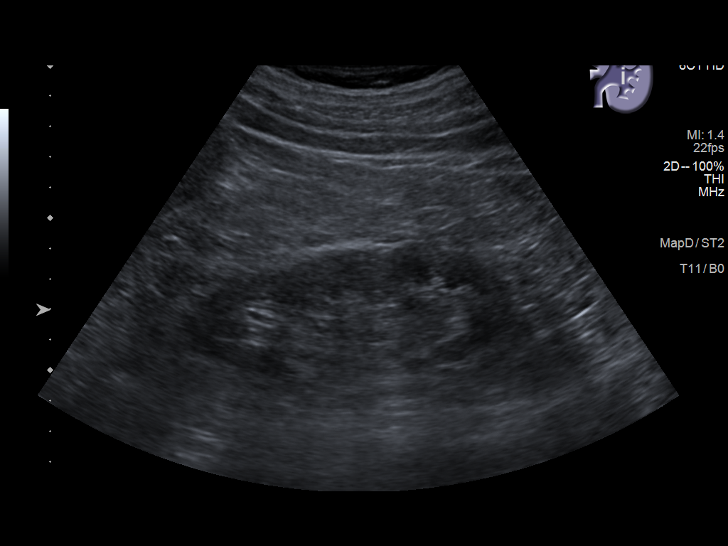
[im 24/39]
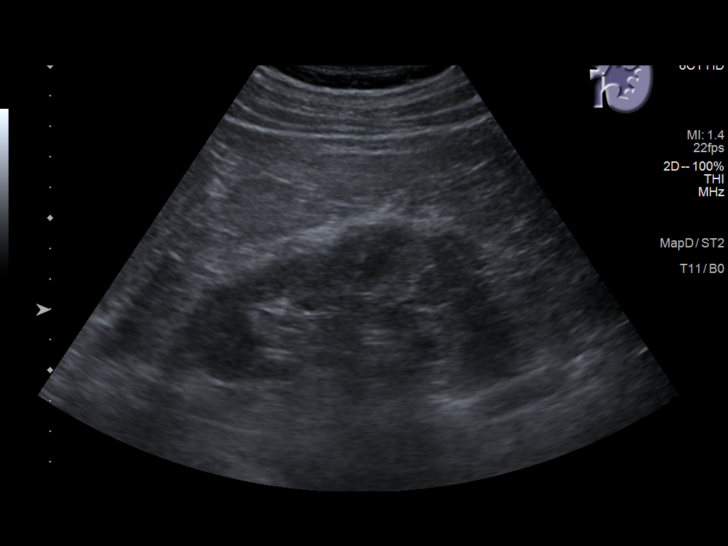
[im 26/39]
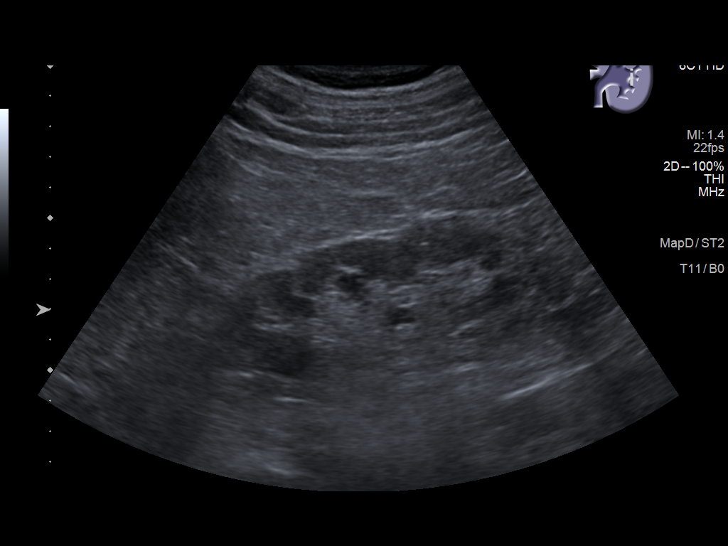
[im 29/39]
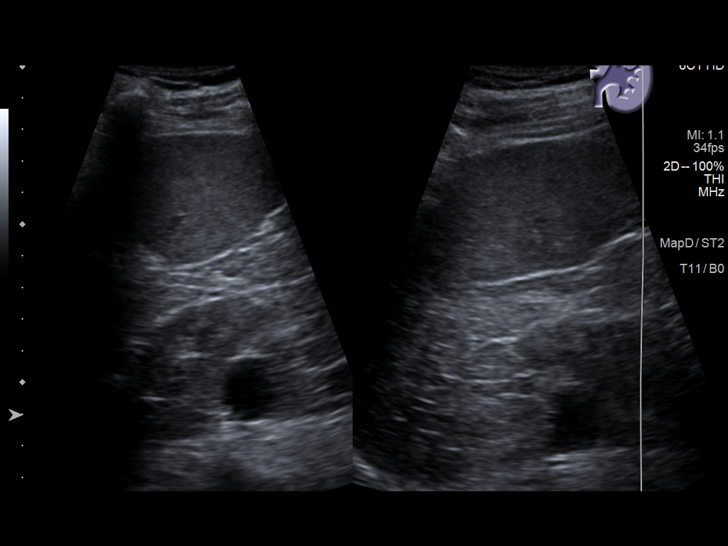
[im 32/39]
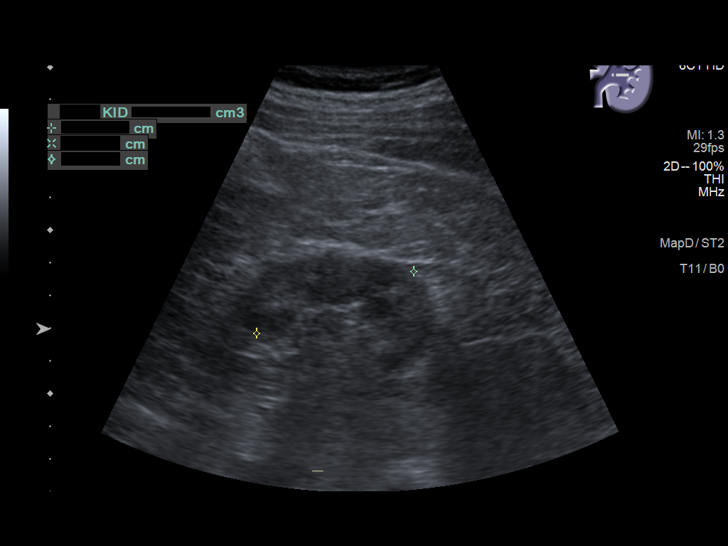
[im 35/39]
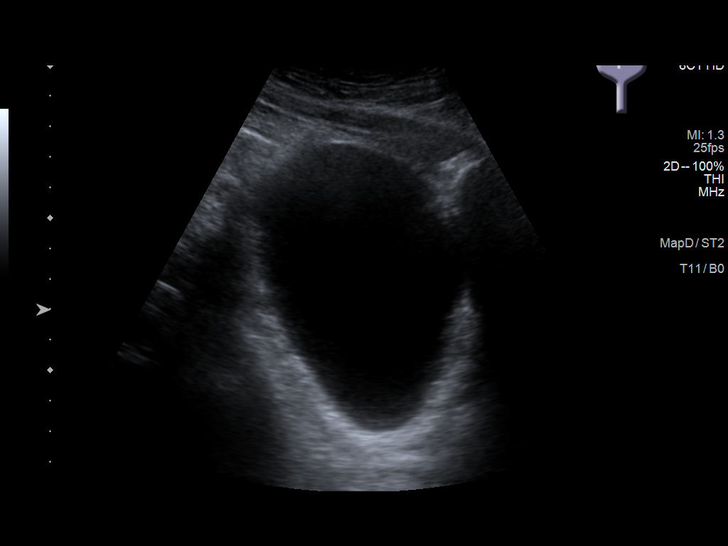
[im 39/39]
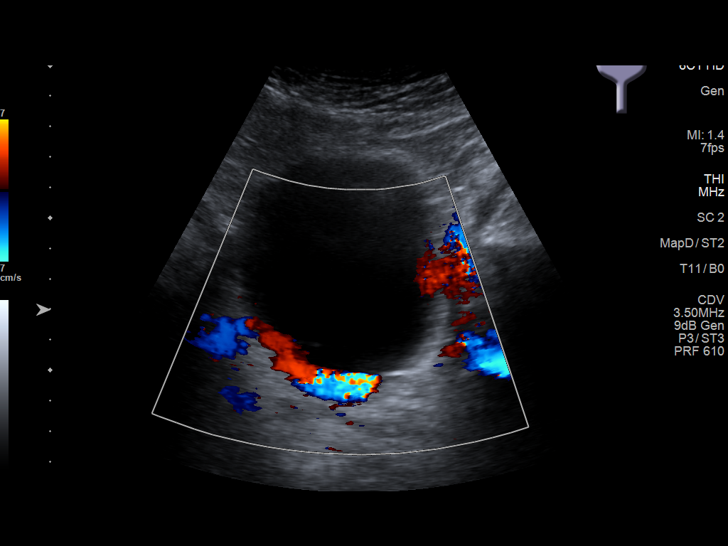

[14 of 25 positions shown; findings below may reference images not displayed]

FINDINGS: Right Kidney:

Renal measurements: 11.2 x 4.8 x 4.4 cm = volume: 125 mL. Mild
cortical thinning is noted. 2 cm simple cyst is seen in lower pole.
Echogenicity within normal limits. No mass or hydronephrosis
visualized.

Left Kidney:

Renal measurements: 10.8 x 4.3 x 5.2 cm = volume: 126 mL. Mild
cortical thinning is noted. 2.1 cm simple cyst is seen in upper
pole. Echogenicity within normal limits. No mass or hydronephrosis
visualized.

Bladder:

Appears normal for degree of bladder distention. Bilateral ureteral
jets are noted.

Other:

None.
IMPRESSION: Mild bilateral cortical thinning is noted. Bilateral simple renal
cysts are noted. No other renal abnormality is noted.

## 2020-11-17 DIAGNOSIS — Z23 Encounter for immunization: Secondary | ICD-10-CM | POA: Diagnosis not present

## 2021-03-31 ENCOUNTER — Other Ambulatory Visit: Payer: Self-pay

## 2021-03-31 ENCOUNTER — Ambulatory Visit (INDEPENDENT_AMBULATORY_CARE_PROVIDER_SITE_OTHER): Payer: Medicare Other | Admitting: Family Medicine

## 2021-03-31 ENCOUNTER — Encounter: Payer: Self-pay | Admitting: Family Medicine

## 2021-03-31 ENCOUNTER — Other Ambulatory Visit: Payer: Self-pay | Admitting: Family Medicine

## 2021-03-31 DIAGNOSIS — J449 Chronic obstructive pulmonary disease, unspecified: Secondary | ICD-10-CM

## 2021-03-31 DIAGNOSIS — J309 Allergic rhinitis, unspecified: Secondary | ICD-10-CM | POA: Diagnosis not present

## 2021-03-31 DIAGNOSIS — I10 Essential (primary) hypertension: Secondary | ICD-10-CM

## 2021-03-31 DIAGNOSIS — E785 Hyperlipidemia, unspecified: Secondary | ICD-10-CM | POA: Diagnosis not present

## 2021-03-31 LAB — BASIC METABOLIC PANEL
BUN: 22 mg/dL (ref 6–23)
CO2: 26 mEq/L (ref 19–32)
Calcium: 9.8 mg/dL (ref 8.4–10.5)
Chloride: 107 mEq/L (ref 96–112)
Creatinine, Ser: 1.49 mg/dL (ref 0.40–1.50)
GFR: 43.75 mL/min — ABNORMAL LOW (ref >60.00)
Glucose, Bld: 96 mg/dL (ref 70–99)
Potassium: 4.7 mEq/L (ref 3.5–5.1)
Sodium: 139 mEq/L (ref 135–145)

## 2021-03-31 MED ORDER — ALBUTEROL SULFATE HFA 108 (90 BASE) MCG/ACT IN AERS: 2.0000 | INHALATION_SPRAY | Freq: Four times a day (QID) | RESPIRATORY_TRACT | 0 refills | Status: DC | PRN

## 2021-03-31 MED ORDER — DOXYCYCLINE HYCLATE 100 MG PO TABS: 100.0000 mg | ORAL_TABLET | Freq: Two times a day (BID) | ORAL | 0 refills | Status: DC

## 2021-03-31 MED ORDER — LISINOPRIL 10 MG PO TABS: 5.0000 mg | ORAL_TABLET | Freq: Every day | ORAL | 3 refills | Status: DC

## 2021-03-31 MED ORDER — MONTELUKAST SODIUM 10 MG PO TABS: 10.0000 mg | ORAL_TABLET | Freq: Every day | ORAL | 3 refills | Status: DC

## 2021-03-31 MED ORDER — SIMVASTATIN 20 MG PO TABS: 20.0000 mg | ORAL_TABLET | Freq: Every day | ORAL | 3 refills | Status: DC

## 2021-03-31 MED ORDER — BUDESONIDE-FORMOTEROL FUMARATE 80-4.5 MCG/ACT IN AERO: INHALATION_SPRAY | RESPIRATORY_TRACT | 3 refills | Status: DC

## 2021-03-31 MED ORDER — PREDNISONE 20 MG PO TABS: 40.0000 mg | ORAL_TABLET | Freq: Every day | ORAL | 0 refills | Status: AC

## 2021-03-31 NOTE — Assessment & Plan Note (Signed)
Adequately controlled.  He will continue lisinopril 10 mg once daily.  We will check a BMP today.

## 2021-03-31 NOTE — Progress Notes (Signed)
Tommi Rumps, MD Phone: [phone]  Lucas Black is a 81 y.o. male who presents today for f/u.  HYPERTENSION Disease Monitoring Home BP Monitoring 120-130/80 Chest pain- no    Dyspnea- no Medications Compliance-  taking lisinopril  Edema- no BMET    Component Value Date/Time   NA 137 09/28/2020 0818   NA 137 05/30/2010 0000   K 4.5 09/28/2020 0818   CL 105 09/28/2020 0818   CO2 25 09/28/2020 0818   GLUCOSE 95 09/28/2020 0818   BUN 27 (H) 09/28/2020 0818   BUN 21 05/30/2010 0000   CREATININE 1.51 (H) 09/28/2020 0818   CALCIUM 10.1 09/28/2020 0818   COPD: Medication compliance- using symbicort BID  Rescue inhaler use- does not have one Dyspnea- no  Wheezing- yes, acute see below  Cough- yes, acute, see below  Productive- yes, yellow with acute illness  Allergic rhinitis: Patient is not using Flonase.  He is taking Singulair.  Generally he reports his symptoms are well controlled.  Postnasal drip/cough: Patient notes onset over a week ago of postnasal drip with sinus congestion and rhinorrhea.  He is blowing yellow mucus out of his nose.  He has noted some wheezing as well as cough productive of yellow mucus.  No fevers.  No COVID exposures or sick exposures.  He has not tested himself for COVID-19.   Social History   Tobacco Use  Smoking Status Former   Types: Cigarettes   Quit date: 03/31/1984   Years since quitting: 37.0  Smokeless Tobacco Never    Current Outpatient Medications on File Prior to Visit  Medication Sig Dispense Refill   aspirin 81 MG tablet Take 81 mg by mouth daily.     finasteride (PROSCAR) 5 MG tablet Take 1 tablet (5 mg total) by mouth daily. 90 tablet 3   tamsulosin (FLOMAX) 0.4 MG CAPS capsule Take 1 capsule (0.4 mg total) by mouth daily. 90 capsule 3   No current facility-administered medications on file prior to visit.     ROS see history of present illness  Objective  Physical Exam Vitals:   03/31/21 0757  BP: 120/80  Pulse:  61  Temp: 98.6 F (37 C)  SpO2: 97%    BP Readings from Last 3 Encounters:  03/31/21 120/80  09/28/20 118/80  05/19/20 (!) 146/87   Wt Readings from Last 3 Encounters:  03/31/21 186 lb 9.6 oz (84.6 kg)  10/26/20 185 lb (83.9 kg)  09/28/20 185 lb (83.9 kg)    Physical Exam Constitutional:      General: He is not in acute distress.    Appearance: He is not diaphoretic.  HENT:     Right Ear: Tympanic membrane normal.     Left Ear: Tympanic membrane normal.  Eyes:     Pupils: Pupils are equal, round, and reactive to light.  Cardiovascular:     Rate and Rhythm: Normal rate and regular rhythm.     Heart sounds: Normal heart sounds.  Pulmonary:     Effort: Pulmonary effort is normal.     Breath sounds: Wheezing (Faint expiratory wheezes lower lung fields bilaterally) present.  Lymphadenopathy:     Cervical: No cervical adenopathy.  Skin:    General: Skin is warm and dry.  Neurological:     Mental Status: He is alert.     Assessment/Plan: Please see individual problem list.  Problem List Items Addressed This Visit     COPD (chronic obstructive pulmonary disease) ([place]) (Chronic)    Patient has  a current exacerbation possibly related to a viral illness versus allergy issues.  He will continue his Symbicort 2 puffs twice daily.  We will treat with prednisone and doxycycline for 7 days.  The patient was also prescribed an albuterol inhaler to use as a rescue inhaler.  He was counseled on sun exposure with doxycycline.  Counseled on risk of agitation, increased appetite, and difficulty sleeping with prednisone.  If he has any worsening symptoms or develop shortness of breath, cough productive of blood, or fevers he will be evaluated in person again.  If his symptoms are not improving he will let us know.  He was counseled to wear a mask if he has to leave the house.      Relevant Medications   doxycycline (VIBRA-TABS) 100 MG tablet   predniSONE (DELTASONE) 20 MG tablet    budesonide-formoterol (SYMBICORT) 80-4.5 MCG/ACT inhaler   montelukast (SINGULAIR) 10 MG tablet   albuterol (VENTOLIN HFA) 108 (90 Base) MCG/ACT inhaler   Hyperlipidemia (Chronic)   Relevant Medications   lisinopril (ZESTRIL) 10 MG tablet   simvastatin (ZOCOR) 20 MG tablet   Hypertension (Chronic)    Adequately controlled.  He will continue lisinopril 10 mg once daily.  We will check a BMP today.      Relevant Medications   lisinopril (ZESTRIL) 10 MG tablet   simvastatin (ZOCOR) 20 MG tablet   Other Relevant Orders   Basic Metabolic Panel (BMET)   Allergic rhinitis    Possibly with exacerbation of allergies or a viral illness.  He will continue Singulair.  We are treating for likely COPD exacerbation with doxycycline and prednisone.      Relevant Medications   montelukast (SINGULAIR) 10 MG tablet     Health Maintenance: The patient reports he will get his second Shingrix vaccine in the near future.  Return in about 6 months (around 09/28/2021) for Hypertension with labs.  This visit occurred during the SARS-CoV-2 public health emergency.  Safety protocols were in place, including screening questions prior to the visit, additional usage of staff PPE, and extensive cleaning of exam room while observing appropriate contact time as indicated for disinfecting solutions.    Tommi Rumps, MD [place]

## 2021-03-31 NOTE — Assessment & Plan Note (Addendum)
Patient has a current exacerbation possibly related to a viral illness versus allergy issues.  He will continue his Symbicort 2 puffs twice daily.  We will treat with prednisone and doxycycline for 7 days.  The patient was also prescribed an albuterol inhaler to use as a rescue inhaler.  He was counseled on sun exposure with doxycycline.  Counseled on risk of agitation, increased appetite, and difficulty sleeping with prednisone.  If he has any worsening symptoms or develop shortness of breath, cough productive of blood, or fevers he will be evaluated in person again.  If his symptoms are not improving he will let us know.  He was counseled to wear a mask if he has to leave the house.

## 2021-03-31 NOTE — Assessment & Plan Note (Signed)
Possibly with exacerbation of allergies or a viral illness.  He will continue Singulair.  We are treating for likely COPD exacerbation with doxycycline and prednisone.

## 2021-03-31 NOTE — Patient Instructions (Addendum)
Nice to see you. We are going to treat you as if you have a COPD exacerbation.  This will be with prednisone and doxycycline. The doxycycline has an increased risk for skin sensitivity to sun exposure.  Please avoid sun exposure while on this. Prednisone can cause agitation, increased appetite, and difficulty sleeping.  If you have any of these and they are excessive please let us know. You can use the albuterol inhaler as prescribed as needed. If you develop shortness of breath, cough productive of blood, or fevers please be evaluated in person again.  If your symptoms are not improving please let us know.

## 2021-04-13 DIAGNOSIS — E785 Hyperlipidemia, unspecified: Secondary | ICD-10-CM | POA: Diagnosis not present

## 2021-04-13 DIAGNOSIS — J449 Chronic obstructive pulmonary disease, unspecified: Secondary | ICD-10-CM | POA: Diagnosis not present

## 2021-04-13 DIAGNOSIS — N1832 Chronic kidney disease, stage 3b: Secondary | ICD-10-CM | POA: Diagnosis not present

## 2021-04-13 DIAGNOSIS — I129 Hypertensive chronic kidney disease with stage 1 through stage 4 chronic kidney disease, or unspecified chronic kidney disease: Secondary | ICD-10-CM | POA: Diagnosis not present

## 2021-04-13 LAB — MICROALBUMIN, URINE: Microalb, Ur: 3

## 2021-05-01 DIAGNOSIS — H353131 Nonexudative age-related macular degeneration, bilateral, early dry stage: Secondary | ICD-10-CM | POA: Diagnosis not present

## 2021-05-01 DIAGNOSIS — H538 Other visual disturbances: Secondary | ICD-10-CM | POA: Diagnosis not present

## 2021-05-01 DIAGNOSIS — H26493 Other secondary cataract, bilateral: Secondary | ICD-10-CM | POA: Diagnosis not present

## 2021-05-01 DIAGNOSIS — H02889 Meibomian gland dysfunction of unspecified eye, unspecified eyelid: Secondary | ICD-10-CM | POA: Diagnosis not present

## 2021-05-19 ENCOUNTER — Ambulatory Visit (INDEPENDENT_AMBULATORY_CARE_PROVIDER_SITE_OTHER): Payer: Medicare Other | Admitting: Urology

## 2021-05-19 ENCOUNTER — Other Ambulatory Visit: Payer: Self-pay

## 2021-05-19 ENCOUNTER — Encounter: Payer: Self-pay | Admitting: Urology

## 2021-05-19 VITALS — BP 155/88 | HR 71 | Ht 68.0 in | Wt 180.0 lb

## 2021-05-19 DIAGNOSIS — N401 Enlarged prostate with lower urinary tract symptoms: Secondary | ICD-10-CM | POA: Diagnosis not present

## 2021-05-19 DIAGNOSIS — R350 Frequency of micturition: Secondary | ICD-10-CM

## 2021-05-19 LAB — MICROSCOPIC EXAMINATION
Bacteria, UA: NONE SEEN
RBC, Urine: NONE SEEN /hpf (ref 0–2)
WBC, UA: NONE SEEN /hpf (ref 0–5)

## 2021-05-19 LAB — URINALYSIS, COMPLETE
Bilirubin, UA: NEGATIVE
Glucose, UA: NEGATIVE
Ketones, UA: NEGATIVE
Leukocytes,UA: NEGATIVE
Nitrite, UA: NEGATIVE
Protein,UA: NEGATIVE
RBC, UA: NEGATIVE
Specific Gravity, UA: <1.005 — ABNORMAL LOW (ref 1.005–1.030)
Urobilinogen, Ur: 0.2 mg/dL (ref 0.2–1.0)
pH, UA: 6 (ref 5.0–7.5)

## 2021-05-19 LAB — BLADDER SCAN AMB NON-IMAGING: Scan Result: 51

## 2021-05-19 MED ORDER — TAMSULOSIN HCL 0.4 MG PO CAPS: 0.4000 mg | ORAL_CAPSULE | Freq: Every day | ORAL | 3 refills | Status: DC

## 2021-05-19 MED ORDER — FINASTERIDE 5 MG PO TABS: 5.0000 mg | ORAL_TABLET | Freq: Every day | ORAL | 3 refills | Status: DC

## 2021-05-19 NOTE — Progress Notes (Signed)
? ?05/19/2021 ?9:26 AM  ? ?Lucas Black ?[birth year] ?[id number] ? ?Referring provider: Leone Haven, MD ?[address] ?[address] ?[place],  [place] [address] ? ?Chief Complaint  ?Patient presents with  ? Benign Prostatic Hypertrophy  ? ? ?Urologic history: ?1.  BPH with lower urinary tract symptoms ?-Outlet procedure 2004 for BPH; records not available ?-Recurrent symptoms around 2014 and started on combination therapy ?-Initially seen here 02/2017  ? ?HPI: ?81 y.o. male presents for annual follow-up. ? ?No problems since last years visit ?Stable LUTS on tamsulosin/finasteride ?Denies dysuria, gross hematuria ?Denies flank, abdominal or pelvic pain ? ? ?PMH: ?Past Medical History:  ?Diagnosis Date  ? COPD (chronic obstructive pulmonary disease) ([place]) 05/30/2011  ? COPD exacerbation ([place]) 07/08/2015  ? Gastric ulcer 04/26/2011  ? 1970's   ? Hyperlipidemia   ? Hypertension   ? Prostatic enlargement 11/30/2010  ? Screening for colon cancer 2011  ? colonoscopy normal  ? Seasonal allergies 06/24/2014  ? ? ?Surgical History: ?Past Surgical History:  ?Procedure Laterality Date  ? CATARACT EXTRACTION Left 2015  ? GASTRIC RESECTION  1973  ? secondary to ulcer  ? PROSTATE SURGERY    ? ? ?Home Medications:  ?Allergies as of 05/19/2021   ?No Known Allergies ?  ? ?  ?Medication List  ?  ? ?  ? Accurate as of May 19, 2021  9:26 AM. If you have any questions, ask your nurse or doctor.  ?  ?  ? ?  ? ?albuterol 108 (90 Base) MCG/ACT inhaler ?Commonly known as: VENTOLIN HFA ?Inhale 2 puffs into the lungs every 6 (six) hours as needed for wheezing or shortness of breath. ?  ?aspirin 81 MG tablet ?Take 81 mg by mouth daily. ?  ?budesonide-formoterol 80-4.5 MCG/ACT inhaler ?Commonly known as: Symbicort ?USE 2 INHALATIONS TWICE A DAY ?  ?doxycycline 100 MG tablet ?Commonly known as: VIBRA-TABS ?Take 1 tablet (100 mg total) by mouth 2 (two) times daily. ?  ?finasteride 5 MG tablet ?Commonly known as: PROSCAR ?Take 1 tablet (5 mg total) by  mouth daily. ?  ?lisinopril 10 MG tablet ?Commonly known as: ZESTRIL ?Take 0.5 tablets (5 mg total) by mouth daily. ?  ?montelukast 10 MG tablet ?Commonly known as: SINGULAIR ?Take 1 tablet (10 mg total) by mouth at bedtime. ?  ?predniSONE 20 MG tablet ?Commonly known as: DELTASONE ?Take 2 tablets (40 mg total) by mouth daily with breakfast. ?  ?simvastatin 20 MG tablet ?Commonly known as: ZOCOR ?Take 1 tablet (20 mg total) by mouth at bedtime. ?  ?tamsulosin 0.4 MG Caps capsule ?Commonly known as: FLOMAX ?Take 1 capsule (0.4 mg total) by mouth daily. ?  ? ?  ? ? ?Allergies: No Known Allergies ? ?Family History: ?Family History  ?Problem Relation Age of Onset  ? Cancer Mother   ?     colon cancer  ? Heart disease Father   ? Heart attack Brother   ? Colon cancer Brother   ? Cancer Brother   ?     skin  ? ? ?Social History:  reports that he quit smoking about 37 years ago. His smoking use included cigarettes. He has never used smokeless tobacco. He reports current alcohol use. He reports that he does not use drugs. ? ? ?Physical Exam: ?BP (!) 155/88   Pulse 71   Ht '5\' 8"'$  (1.727 m)   Wt 180 lb (81.6 kg)   BMI 27.37 kg/m?   ?Constitutional:  Alert and oriented, No acute  distress. ?HEENT: [place] AT, moist mucus membranes.  Trachea midline, no masses. ?Cardiovascular: No clubbing, cyanosis, or edema. ?Respiratory: Normal respiratory effort, no increased work of breathing. ? ? ?Assessment & Plan:   ? ?1. Benign prostatic hyperplasia with urinary frequency ?Stable voiding symptoms on tamsulosin/finasteride ?Refills sent to pharmacy ?Bladder scan PVR 51 mL ?Continue annual follow-up ? ? ?[name], MD ? ?[facility] ?[address] ?[place], [place] [address] ?[phone] ? ?   ?

## 2021-09-22 DIAGNOSIS — H1045 Other chronic allergic conjunctivitis: Secondary | ICD-10-CM | POA: Diagnosis not present

## 2021-09-22 DIAGNOSIS — H26493 Other secondary cataract, bilateral: Secondary | ICD-10-CM | POA: Diagnosis not present

## 2021-09-22 DIAGNOSIS — H353131 Nonexudative age-related macular degeneration, bilateral, early dry stage: Secondary | ICD-10-CM | POA: Diagnosis not present

## 2021-09-22 DIAGNOSIS — H538 Other visual disturbances: Secondary | ICD-10-CM | POA: Diagnosis not present

## 2021-09-26 ENCOUNTER — Other Ambulatory Visit: Payer: Self-pay

## 2021-09-26 DIAGNOSIS — J309 Allergic rhinitis, unspecified: Secondary | ICD-10-CM

## 2021-09-26 MED ORDER — MONTELUKAST SODIUM 10 MG PO TABS: 10.0000 mg | ORAL_TABLET | Freq: Every day | ORAL | 3 refills | Status: AC

## 2021-09-29 ENCOUNTER — Encounter: Payer: Self-pay | Admitting: Family Medicine

## 2021-09-29 ENCOUNTER — Ambulatory Visit (INDEPENDENT_AMBULATORY_CARE_PROVIDER_SITE_OTHER): Payer: Medicare Other | Admitting: Family Medicine

## 2021-09-29 DIAGNOSIS — G479 Sleep disorder, unspecified: Secondary | ICD-10-CM

## 2021-09-29 DIAGNOSIS — J449 Chronic obstructive pulmonary disease, unspecified: Secondary | ICD-10-CM

## 2021-09-29 DIAGNOSIS — I10 Essential (primary) hypertension: Secondary | ICD-10-CM

## 2021-09-29 LAB — COMPREHENSIVE METABOLIC PANEL
ALT: 12 U/L (ref 0–53)
AST: 16 U/L (ref 0–37)
Albumin: 4.4 g/dL (ref 3.5–5.2)
Alkaline Phosphatase: 79 U/L (ref 39–117)
BUN: 23 mg/dL (ref 6–23)
CO2: 26 mEq/L (ref 19–32)
Calcium: 9.9 mg/dL (ref 8.4–10.5)
Chloride: 105 mEq/L (ref 96–112)
Creatinine, Ser: 1.62 mg/dL — ABNORMAL HIGH (ref 0.40–1.50)
GFR: 39.43 mL/min — ABNORMAL LOW (ref >60.00)
Glucose, Bld: 93 mg/dL (ref 70–99)
Potassium: 4.3 mEq/L (ref 3.5–5.1)
Sodium: 139 mEq/L (ref 135–145)
Total Bilirubin: 0.6 mg/dL (ref 0.2–1.2)
Total Protein: 6.5 g/dL (ref 6.0–8.3)

## 2021-09-29 LAB — LIPID PANEL
Cholesterol: 146 mg/dL (ref 0–200)
HDL: 45.9 mg/dL (ref >39.00)
LDL Cholesterol: 78 mg/dL (ref 0–99)
NonHDL: 99.92
Total CHOL/HDL Ratio: 3
Triglycerides: 111 mg/dL (ref 0.0–149.0)
VLDL: 22.2 mg/dL (ref 0.0–40.0)

## 2021-09-29 MED ORDER — TRAZODONE HCL 50 MG PO TABS: 25.0000 mg | ORAL_TABLET | Freq: Every evening | ORAL | 1 refills | Status: DC | PRN

## 2021-09-29 NOTE — Patient Instructions (Signed)
Nice to see you. Please start checking her blood pressure on a daily basis and write it down.  You should check your blood pressure after being at rest for 10 to 15 minutes.  Please bring me a copy of your blood pressure log in 2 weeks. We will get lab work today and contact you with the results.

## 2021-09-29 NOTE — Assessment & Plan Note (Addendum)
Borderline elevated for age.  Goal BP of less than 150/90 given his age.  Discussed the option of increasing his lisinopril versus monitoring for couple weeks at home.  He opted to monitor at home.  He will bring his blood pressure log in for me to review in a couple of weeks.  He will continue lisinopril 5 mg once daily.

## 2021-09-29 NOTE — Assessment & Plan Note (Signed)
We will trial trazodone 25-50 mg nightly as needed for sleep.  He will monitor for excessive drowsiness the next day and if that occurs he will discontinue the trazodone.  He was advised of the risk of priapism with this.

## 2021-09-29 NOTE — Progress Notes (Signed)
Tommi Rumps, MD Phone: [phone]  Lucas Black is a 81 y.o. male who presents today for f/u.  HYPERTENSION Disease Monitoring Home BP Monitoring not checking Chest pain- no    Dyspnea- no Medications Compliance-  taking lisinopril.   Edema- no BMET    Component Value Date/Time   NA 139 03/31/2021 0825   NA 137 05/30/2010 0000   K 4.7 03/31/2021 0825   CL 107 03/31/2021 0825   CO2 26 03/31/2021 0825   GLUCOSE 96 03/31/2021 0825   BUN 22 03/31/2021 0825   BUN 21 05/30/2010 0000   CREATININE 1.49 03/31/2021 0825   CALCIUM 9.8 03/31/2021 0825   COPD: Medication compliance- using symbicort daily  Rescue inhaler use- rarely Dyspnea- no  Wheezing- rare with heat  Cough- no   Sleeping difficulty: Patient notes he wakes up after about 4 hours at night.  He goes to bed between 9 and 9:30 PM.  He will subsequently wake up 4 to 5 hours later.  He does not have any caffeine later in the day.  He does not have consistent alcohol use.  He does watch some TV the hour before bed at times.  No depression or anxiety.   Social History   Tobacco Use  Smoking Status Former   Types: Cigarettes   Quit date: 03/31/1984   Years since quitting: 37.5  Smokeless Tobacco Never    Current Outpatient Medications on File Prior to Visit  Medication Sig Dispense Refill   albuterol (VENTOLIN HFA) 108 (90 Base) MCG/ACT inhaler Inhale 2 puffs into the lungs every 6 (six) hours as needed for wheezing or shortness of breath. 8 g 0   aspirin 81 MG tablet Take 81 mg by mouth daily.     budesonide-formoterol (SYMBICORT) 80-4.5 MCG/ACT inhaler USE 2 INHALATIONS TWICE A DAY 30.6 g 3   doxycycline (VIBRA-TABS) 100 MG tablet Take 1 tablet (100 mg total) by mouth 2 (two) times daily. 14 tablet 0   finasteride (PROSCAR) 5 MG tablet Take 1 tablet (5 mg total) by mouth daily. 90 tablet 3   lisinopril (ZESTRIL) 10 MG tablet Take 0.5 tablets (5 mg total) by mouth daily. 45 tablet 3   montelukast (SINGULAIR) 10  MG tablet Take 1 tablet (10 mg total) by mouth at bedtime. 90 tablet 3   predniSONE (DELTASONE) 20 MG tablet Take 2 tablets (40 mg total) by mouth daily with breakfast. 14 tablet 0   simvastatin (ZOCOR) 20 MG tablet Take 1 tablet (20 mg total) by mouth at bedtime. 90 tablet 3   tamsulosin (FLOMAX) 0.4 MG CAPS capsule Take 1 capsule (0.4 mg total) by mouth daily. 90 capsule 3   No current facility-administered medications on file prior to visit.     ROS see history of present illness  Objective  Physical Exam Vitals:   09/29/21 0803 09/29/21 0816  BP: (!) 150/80 (!) 150/84  Pulse: 60   Temp: 98 F (36.7 C)   SpO2: 98%     BP Readings from Last 3 Encounters:  09/29/21 (!) 150/84  05/19/21 (!) 155/88  03/31/21 120/80   Wt Readings from Last 3 Encounters:  09/29/21 184 lb 9.6 oz (83.7 kg)  05/19/21 180 lb (81.6 kg)  03/31/21 186 lb 9.6 oz (84.6 kg)    Physical Exam Constitutional:      General: He is not in acute distress.    Appearance: He is not diaphoretic.  Cardiovascular:     Rate and Rhythm: Normal rate and regular  rhythm.     Heart sounds: Normal heart sounds.  Pulmonary:     Effort: Pulmonary effort is normal.     Breath sounds: Normal breath sounds.  Skin:    General: Skin is warm and dry.  Neurological:     Mental Status: He is alert.      Assessment/Plan: Please see individual problem list.  Problem List Items Addressed This Visit     COPD (chronic obstructive pulmonary disease) ([place]) (Chronic)    Well-controlled.  He will continue Symbicort 2 puffs twice daily.  He can continue as needed albuterol use.      Hypertension (Chronic)    Borderline elevated for age.  Goal BP of less than 150/90 given his age.  Discussed the option of increasing his lisinopril versus monitoring for couple weeks at home.  He opted to monitor at home.  He will bring his blood pressure log in for me to review in a couple of weeks.  He will continue lisinopril 5 mg once  daily.      Relevant Orders   Comp Met (CMET)   Lipid panel   Sleeping difficulty    We will trial trazodone 25-50 mg nightly as needed for sleep.  He will monitor for excessive drowsiness the next day and if that occurs he will discontinue the trazodone.  He was advised of the risk of priapism with this.      Relevant Medications   traZODone (DESYREL) 50 MG tablet    Return in about 6 months (around 04/01/2022) for htn.   Tommi Rumps, MD [place]

## 2021-09-29 NOTE — Assessment & Plan Note (Signed)
Well-controlled.  He will continue Symbicort 2 puffs twice daily.  He can continue as needed albuterol use.

## 2021-10-05 ENCOUNTER — Other Ambulatory Visit: Payer: Self-pay

## 2021-10-05 NOTE — Patient Outreach (Signed)
  Care Coordination   Initial Visit Note   10/05/2021 Name: MARQUAIL BRADWELL MRN: [id number] DOB: 10-27-1940  COLTAN SPINELLO is a 81 y.o. year old male who sees Caryl Bis, Angela Adam, MD for primary care. I spoke with  [name] by phone today  What matters to the patients health and wellness today?  " Patient states he does not need services at this time."  Patient given contact phone number for [facility] and advised to call if services needed in the future.    Goals Addressed   None     SDOH assessments and interventions completed:  No   Care Coordination Interventions Activated:  No  Care Coordination Interventions:  No, not indicated   Follow up plan:  No follow up. Patient declined services.     Encounter Outcome:  Pt. Refused   Quinn Plowman RN,BSN,CCM RN Care Manager Coordinator [phone]

## 2021-10-16 ENCOUNTER — Telehealth: Payer: Self-pay | Admitting: Family Medicine

## 2021-10-16 NOTE — Telephone Encounter (Signed)
Patient dropped off BP readings. Readings are upfront in Dr Ellen Henri color folder.

## 2021-10-18 NOTE — Telephone Encounter (Signed)
Home BPs are acceptable. Patient can continue with his current BP regimen.

## 2021-10-18 NOTE — Telephone Encounter (Signed)
Patient called and notified to keep BP regimen the same.

## 2021-11-02 ENCOUNTER — Ambulatory Visit: Payer: Medicare Other

## 2021-11-14 ENCOUNTER — Ambulatory Visit (INDEPENDENT_AMBULATORY_CARE_PROVIDER_SITE_OTHER): Payer: Medicare Other

## 2021-11-14 VITALS — Ht 68.0 in | Wt 184.0 lb

## 2021-11-14 DIAGNOSIS — Z0000 Encounter for general adult medical examination without abnormal findings: Secondary | ICD-10-CM

## 2021-11-14 NOTE — Progress Notes (Signed)
Subjective:   Lucas Black is a 81 y.o. male who presents for Medicare Annual/Subsequent preventive examination.  Review of Systems    No ROS.  Medicare Wellness Virtual Visit.  Visual/audio telehealth visit, UTA vital signs.   See social history for additional risk factors.   Cardiac Risk Factors include: advanced age (>22mn, >>34women);male gender;hypertension     Objective:    Today's Vitals   11/14/21 1043  Weight: 184 lb (83.5 kg)  Height: '5\' 8"'$  (1.727 m)   Body mass index is 27.98 kg/m.     11/14/2021   11:43 AM 10/26/2020    2:14 PM 10/26/2019    2:54 PM 07/16/2018    3:24 PM 07/12/2017    3:11 PM 06/22/2016   10:14 AM 06/23/2015    9:29 AM  Advanced Directives  Does Patient Have a Medical Advance Directive? Yes Yes No Yes No No No  Type of [place]amed[place]of [place] will [place] will  [place] will Living will;Healthcare Power of Attorney    Does patient want to make changes to medical advance directive? No - Patient declined No - Patient declined  No - Patient declined Yes (MAU/Ambulatory/Procedural Areas - Information given)    Copy of [place] Chart? Yes - validated most recent copy scanned in chart (See row information) Yes - validated most recent copy scanned in chart (See row information)  Yes - validated most recent copy scanned in chart (See row information)     Would patient like information on creating a medical advance directive?   Yes (MAU/Ambulatory/Procedural Areas - Information given)   No - Patient declined Yes - Educational materials given    Current Medications (verified) Outpatient Encounter Medications as of 11/14/2021  Medication Sig   albuterol (VENTOLIN HFA) 108 (90 Base) MCG/ACT inhaler Inhale 2 puffs into the lungs every 6 (six) hours as needed for wheezing or shortness of breath.   aspirin 81 MG tablet Take 81 mg by mouth daily.    budesonide-formoterol (SYMBICORT) 80-4.5 MCG/ACT inhaler USE 2 INHALATIONS TWICE A DAY   doxycycline (VIBRA-TABS) 100 MG tablet Take 1 tablet (100 mg total) by mouth 2 (two) times daily.   finasteride (PROSCAR) 5 MG tablet Take 1 tablet (5 mg total) by mouth daily.   lisinopril (ZESTRIL) 10 MG tablet Take 0.5 tablets (5 mg total) by mouth daily.   montelukast (SINGULAIR) 10 MG tablet Take 1 tablet (10 mg total) by mouth at bedtime.   predniSONE (DELTASONE) 20 MG tablet Take 2 tablets (40 mg total) by mouth daily with breakfast.   simvastatin (ZOCOR) 20 MG tablet Take 1 tablet (20 mg total) by mouth at bedtime.   tamsulosin (FLOMAX) 0.4 MG CAPS capsule Take 1 capsule (0.4 mg total) by mouth daily.   traZODone (DESYREL) 50 MG tablet Take 0.5-1 tablets (25-50 mg total) by mouth at bedtime as needed for sleep.   No facility-administered encounter medications on file as of 11/14/2021.    Allergies (verified) Patient has no known allergies.   History: Past Medical History:  Diagnosis Date   [place]   COPD exacerbation ([place] 07/08/2015   Gastric ulcer 04/26/2011   1970's    Hyperlipidemia    Hypertension    Prostatic enlargement 11/30/2010   Screening for colon cancer 2011   colonoscopy normal   Seasonal allergies 06/24/2014   Past Surgical History:  Procedure Laterality Date  CATARACT EXTRACTION Left 2015   GASTRIC RESECTION  1973   secondary to ulcer   PROSTATE SURGERY     Family History  Problem Relation Age of Onset   Cancer Mother        colon cancer   Heart disease Father    Heart attack Brother    Colon cancer Brother    Cancer Brother        skin   Social History   Socioeconomic History   Marital status: Married    Spouse name: Not on file   Number of children: Not on file   Years of education: Not on file   Highest education level: Not on file  Occupational History    Employer: retired  Tobacco Use   Smoking  status: Former    Types: Cigarettes    Quit date: 03/31/1984    Years since quitting: 37.6   Smokeless tobacco: Never  Vaping Use   Vaping Use: Never used  Substance and Sexual Activity   Alcohol use: Yes    Comment: rarely   Drug use: No   Sexual activity: Yes  Other Topics Concern   Not on file  Social History Narrative   Not on file   Social Determinants of Health   Financial Resource Strain: Low Risk  (11/14/2021)   Overall Financial Resource Strain (CARDIA)    Difficulty of Paying Living Expenses: Not hard at all  Food Insecurity: No Food Insecurity (11/14/2021)   Hunger Vital Sign    Worried About Running Out of Food in the Last Year: Never true    [place] in the Last Year: Never true  Transportation Needs: No Transportation Needs (11/14/2021)   PRAPARE - Hydrologist (Medical): No    Lack of Transportation (Non-Medical): No  Physical Activity: Sufficiently Active (11/14/2021)   Exercise Vital Sign    Days of Exercise per Week: 5 days    Minutes of Exercise per Session: 30 min  Stress: No Stress Concern Present (11/14/2021)   [place]    Feeling of Stress : Not at all  Social Connections: Unknown (11/14/2021)   Social Connection and Isolation Panel [NHANES]    Frequency of Communication with Friends and Family: Not on file    Frequency of Social Gatherings with Friends and Family: Not on file    Attends Religious Services: Not on file    Active Member of Clubs or Organizations: Not on file    Attends Archivist Meetings: Not on file    Marital Status: Married    Tobacco Counseling Counseling given: Not Answered   Clinical Intake:  Pre-visit preparation completed: Yes           How often do you need to have someone help you when you read instructions, pamphlets, or other written materials from your doctor or pharmacy?: 1 - Never  Interpreter  Needed?: No    Activities of Daily Living    11/14/2021   11:46 AM  In your present state of health, do you have any difficulty performing the following activities:  Hearing? 1  Vision? 0  Difficulty concentrating or making decisions? 0  Walking or climbing stairs? 0  Dressing or bathing? 0  Doing errands, shopping? 0  Preparing Food and eating ? N  Using the Toilet? N  In the past six months, have you accidently leaked urine? N  Do you have problems with  loss of bowel control? N  Managing your Medications? N  Managing your Finances? N  Housekeeping or managing your Housekeeping? N   Patient Care Team: Leone Haven, MD as PCP - General (Family Medicine)  Indicate any recent Medical Services you may have received from other than [facility] providers in the past year (date may be approximate).     Assessment:   This is a routine wellness examination for Paras.  Virtual Visit via Telephone Note  I connected with  Lucas Black on 11/14/21 at 10:30 AM EDT by telephone and verified that I am speaking with the correct person using two identifiers.  Location: Patient: home Provider: office Persons participating in the virtual visit: patient/Nurse Health Advisor   I discussed the limitations of performing an evaluation and management service by telehealth. We continued and completed visit with audio only. Some vital signs may be absent or patient reported.     Virtual Visit via Telephone Note  I connected with  Lucas Black on 11/14/21 at 10:30 AM EDT by telephone and verified that I am speaking with the correct person using two identifiers.  Location: Patient: home Provider:  Persons participating in the virtual visit: patient/Nurse Health Advisor   I discussed the limitations of performing an evaluation and management service by telehealth. We continued and completed visit with audio only. Some vital signs may be absent or patient reported.   Hearing/Vision  screen Hearing Screening - Comments:: Hearing aid, bilateral  He does not wear his hearing aids often. Vision Screening - Comments:: Followed by Dr. Ellin Mayhew  Wears corrective lenses  They have seen their ophthalmologist in the last 12 months.   Dietary issues and exercise activities discussed: Current Exercise Habits: Home exercise routine, Intensity: Mild Regular diet Good water intake   Goals Addressed               This Visit's Progress     Patient Stated     Eat a healthier diet (pt-stated)        Stay hydrated.      Update Immunization Record (pt-stated)        Shingles vaccine-completed Tdap vaccine- completed       Depression Screen    11/14/2021   11:42 AM 09/29/2021    8:04 AM 10/26/2020    2:12 PM 10/26/2019    2:51 PM 09/22/2019    8:30 AM 03/24/2019    9:37 AM 07/16/2018    3:24 PM  PHQ 2/9 Scores  PHQ - 2 Score 0 0 0 0 0 0 0    Fall Risk    11/14/2021   11:45 AM 09/29/2021    8:04 AM 10/26/2020    2:15 PM 10/26/2019    2:59 PM 09/22/2019    8:30 AM  [place] in the past year? 0 0 0 0 0  Number falls in past yr: 0 0  0 0  Injury with Fall? 0 0     Risk for fall due to :  No Fall Risks     Follow up Falls evaluation completed Falls evaluation completed Falls evaluation completed Falls evaluation completed Falls evaluation completed    [place]: Home free of loose throw rugs in walkways, pet beds, electrical cords, etc? Yes  Adequate lighting in your home to reduce risk of falls? Yes   ASSISTIVE DEVICES UTILIZED TO PREVENT FALLS: Life alert? No  Use of a cane, walker or w/c?  No   TIMED UP AND GO: Was the test performed? No .   Cognitive Function:    06/22/2016   10:27 AM 06/23/2015    9:36 AM  MMSE - Mini Mental State Exam  Orientation to time 5 5  Orientation to Place 5 5  Registration 3 3  Attention/ Calculation 5 5  Recall 3 3  Language- name 2 objects 2 2  Language- repeat 1 1  Language-  follow 3 step command 3 3  Language- read & follow direction 1 1  Write a sentence 1 1  Copy design 1 1  Total score 30 30        11/14/2021   11:47 AM 10/26/2020    2:16 PM 10/26/2019    3:13 PM 07/16/2018    3:26 PM 07/12/2017    3:33 PM  6CIT Screen  What Year? 0 points 0 points 0 points 0 points 0 points  What month? 0 points 0 points 0 points 0 points 0 points  What time? 0 points 0 points 0 points 0 points 0 points  Count back from 20 0 points 0 points  0 points 0 points  Months in reverse 0 points 0 points  0 points 0 points  Repeat phrase 0 points   0 points 0 points  Total Score 0 points   0 points 0 points    Immunizations Immunization History  Administered Date(s) Administered   Influenza Split 11/30/2010   Influenza, High Dose Seasonal PF 11/17/2016, 11/15/2017   Influenza,inj,Quad PF,6+ Mos 12/19/2012, 12/23/2013, 12/15/2014, 11/16/2020   Influenza-Unspecified 11/23/2015, 11/03/2017   PFIZER(Purple Top)SARS-COV-2 Vaccination 04/09/2019, 04/30/2019, 12/16/2019, 07/25/2020   Pneumococcal Conjugate-13 06/23/2013   Pneumococcal Polysaccharide-23 12/06/2007    TDAP status: Due, Education has been provided regarding the importance of this vaccine. Advised may receive this vaccine at local pharmacy or Health Dept. Aware to provide a copy of the vaccination record if obtained from local pharmacy or Health Dept. Verbalized acceptance and understanding.reports completed. Encouraged to update immunization record. Deferred.   Flu Vaccine status: Due, Education has been provided regarding the importance of this vaccine. Advised may receive this vaccine at local pharmacy or Health Dept. Aware to provide a copy of the vaccination record if obtained from local pharmacy or Health Dept. Verbalized acceptance and understanding.  Covid-19 vaccine status: Completed vaccines  Shingles vaccine- reports completed. Encouraged to update immunization record. Deferred.   Screening  Tests Health Maintenance  Topic Date Due   COVID-19 Vaccine (5 - Pfizer series) 11/30/2021 (Originally 09/19/2020)   TETANUS/TDAP  02/02/2022 (Originally 10/08/1959)   Zoster Vaccines- Shingrix (1 of 2) 02/02/2022 (Originally 10/07/1989)   INFLUENZA VACCINE  06/03/2022 (Originally 10/03/2021)   Pneumonia Vaccine 27+ Years old  Completed   HPV VACCINES  Aged Out   Health Maintenance There are no preventive care reminders to display for this patient.  Lung Cancer Screening: (Low Dose CT Chest recommended if Age 4-80 years, 30 pack-year currently smoking OR have quit w/in 15years.) does not qualify.   Hepatitis C Screening: does not qualify  Vision Screening: Recommended annual ophthalmology exams for early detection of glaucoma and other disorders of the eye.  Dental Screening: Recommended annual dental exams for proper oral hygiene  Community Resource Referral / Chronic Care Management: CRR required this visit?  No   CCM required this visit?  No      Plan:     I have personally reviewed and noted the following in the patient's chart:   Medical  and social history Use of alcohol, tobacco or illicit drugs  Current medications and supplements including opioid prescriptions. Patient is not currently taking opioid prescriptions. Functional ability and status Nutritional status Physical activity Advanced directives List of other physicians Hospitalizations, surgeries, and ER visits in previous 12 months Vitals Screenings to include cognitive, depression, and falls Referrals and appointments  In addition, I have reviewed and discussed with patient certain preventive protocols, quality metrics, and best practice recommendations. A written personalized care plan for preventive services as well as general preventive health recommendations were provided to patient.     Varney Biles, LPN   09/02/4101

## 2021-11-14 NOTE — Patient Instructions (Addendum)
Lucas Black , Thank you for taking time to come for your Medicare Wellness Visit. I appreciate your ongoing commitment to your health goals. Please review the following plan we discussed and let me know if I can assist you in the future.   These are the goals we discussed:  Goals       Patient Stated     Eat a healthier diet (pt-stated)      Stay hydrated.      Update Immunization Record (pt-stated)      Shingles vaccine-completed Tdap vaccine- completed        This is a list of the screening recommended for you and due dates:  Health Maintenance  Topic Date Due   COVID-19 Vaccine (5 - Pfizer series) 11/30/2021*   Tetanus Vaccine  02/02/2022*   Zoster (Shingles) Vaccine (1 of 2) 02/02/2022*   Flu Shot  06/03/2022*   Pneumonia Vaccine  Completed   HPV Vaccine  Aged Out  *Topic was postponed. The date shown is not the original due date.    Next appointment: Follow up in one year for your annual wellness visit.   Preventive Care 48 Years and Older, Male Preventive care refers to lifestyle choices and visits with your health care provider that can promote health and wellness. What does preventive care include? A yearly physical exam. This is also called an annual well check. Dental exams once or twice a year. Routine eye exams. Ask your health care provider how often you should have your eyes checked. Personal lifestyle choices, including: Daily care of your teeth and gums. Regular physical activity. Eating a healthy diet. Avoiding tobacco and drug use. Limiting alcohol use. Practicing safe sex. Taking low doses of aspirin every day. Taking vitamin and mineral supplements as recommended by your health care provider. What happens during an annual well check? The services and screenings done by your health care provider during your annual well check will depend on your age, overall health, lifestyle risk factors, and family history of disease. Counseling  Your health care  provider may ask you questions about your: Alcohol use. Tobacco use. Drug use. Emotional well-being. Home and relationship well-being. Sexual activity. Eating habits. History of falls. Memory and ability to understand (cognition). Work and work Statistician. Screening  You may have the following tests or measurements: Height, weight, and BMI. Blood pressure. Lipid and cholesterol levels. These may be checked every 5 years, or more frequently if you are over 68 years old. Skin check. Lung cancer screening. You may have this screening every year starting at age 61 if you have a 30-pack-year history of smoking and currently smoke or have quit within the past 15 years. Fecal occult blood test (FOBT) of the stool. You may have this test every year starting at age 85. Flexible sigmoidoscopy or colonoscopy. You may have a sigmoidoscopy every 5 years or a colonoscopy every 10 years starting at age 62. Prostate cancer screening. Recommendations will vary depending on your family history and other risks. Hepatitis C blood test. Hepatitis B blood test. Sexually transmitted disease (STD) testing. Diabetes screening. This is done by checking your blood sugar (glucose) after you have not eaten for a while (fasting). You may have this done every 1-3 years. Abdominal aortic aneurysm (AAA) screening. You may need this if you are a current or former smoker. Osteoporosis. You may be screened starting at age 27 if you are at high risk. Talk with your health care provider about your test results, treatment  options, and if necessary, the need for more tests. Vaccines  Your health care provider may recommend certain vaccines, such as: Influenza vaccine. This is recommended every year. Tetanus, diphtheria, and acellular pertussis (Tdap, Td) vaccine. You may need a Td booster every 10 years. Zoster vaccine. You may need this after age 61. Pneumococcal 13-valent conjugate (PCV13) vaccine. One dose is  recommended after age 54. Pneumococcal polysaccharide (PPSV23) vaccine. One dose is recommended after age 28. Talk to your health care provider about which screenings and vaccines you need and how often you need them. This information is not intended to replace advice given to you by your health care provider. Make sure you discuss any questions you have with your health care provider. Document Released: 03/18/2015 Document Revised: 11/09/2015 Document Reviewed: 12/21/2014 Elsevier Interactive Patient Education  2017 [place] Prevention in the Home Falls can cause injuries. They can happen to people of all ages. There are many things you can do to make your home safe and to help prevent falls. What can I do on the outside of my home? Regularly fix the edges of walkways and driveways and fix any cracks. Remove anything that might make you trip as you walk through a door, such as a raised step or threshold. Trim any bushes or trees on the path to your home. Use bright outdoor lighting. Clear any walking paths of anything that might make someone trip, such as rocks or tools. Regularly check to see if handrails are loose or broken. Make sure that both sides of any steps have handrails. Any raised decks and porches should have guardrails on the edges. Have any leaves, snow, or ice cleared regularly. Use sand or salt on walking paths during winter. Clean up any spills in your garage right away. This includes oil or grease spills. What can I do in the bathroom? Use night lights. Install grab bars by the toilet and in the tub and shower. Do not use towel bars as grab bars. Use non-skid mats or decals in the tub or shower. If you need to sit down in the shower, use a plastic, non-slip stool. Keep the floor dry. Clean up any water that spills on the floor as soon as it happens. Remove soap buildup in the tub or shower regularly. Attach bath mats securely with double-sided non-slip rug  tape. Do not have throw rugs and other things on the floor that can make you trip. What can I do in the bedroom? Use night lights. Make sure that you have a light by your bed that is easy to reach. Do not use any sheets or blankets that are too big for your bed. They should not hang down onto the floor. Have a firm chair that has side arms. You can use this for support while you get dressed. Do not have throw rugs and other things on the floor that can make you trip. What can I do in the kitchen? Clean up any spills right away. Avoid walking on wet floors. Keep items that you use a lot in easy-to-reach places. If you need to reach something above you, use a strong step stool that has a grab bar. Keep electrical cords out of the way. Do not use floor polish or wax that makes floors slippery. If you must use wax, use non-skid floor wax. Do not have throw rugs and other things on the floor that can make you trip. What can I do with my stairs? Do not  leave any items on the stairs. Make sure that there are handrails on both sides of the stairs and use them. Fix handrails that are broken or loose. Make sure that handrails are as long as the stairways. Check any carpeting to make sure that it is firmly attached to the stairs. Fix any carpet that is loose or worn. Avoid having throw rugs at the top or bottom of the stairs. If you do have throw rugs, attach them to the floor with carpet tape. Make sure that you have a light switch at the top of the stairs and the bottom of the stairs. If you do not have them, ask someone to add them for you. What else can I do to help prevent falls? Wear shoes that: Do not have high heels. Have rubber bottoms. Are comfortable and fit you well. Are closed at the toe. Do not wear sandals. If you use a stepladder: Make sure that it is fully opened. Do not climb a closed stepladder. Make sure that both sides of the stepladder are locked into place. Ask someone to  hold it for you, if possible. Clearly mark and make sure that you can see: Any grab bars or handrails. First and last steps. Where the edge of each step is. Use tools that help you move around (mobility aids) if they are needed. These include: Canes. Walkers. Scooters. Crutches. Turn on the lights when you go into a dark area. Replace any light bulbs as soon as they burn out. Set up your furniture so you have a clear path. Avoid moving your furniture around. If any of your floors are uneven, fix them. If there are any pets around you, be aware of where they are. Review your medicines with your doctor. Some medicines can make you feel dizzy. This can increase your chance of falling. Ask your doctor what other things that you can do to help prevent falls. This information is not intended to replace advice given to you by your health care provider. Make sure you discuss any questions you have with your health care provider. Document Released: 12/16/2008 Document Revised: 07/28/2015 Document Reviewed: 03/26/2014 Elsevier Interactive Patient Education  2017 [employer].

## 2022-03-07 ENCOUNTER — Other Ambulatory Visit: Payer: Self-pay | Admitting: Family Medicine

## 2022-03-07 DIAGNOSIS — G479 Sleep disorder, unspecified: Secondary | ICD-10-CM

## 2022-04-02 ENCOUNTER — Ambulatory Visit (INDEPENDENT_AMBULATORY_CARE_PROVIDER_SITE_OTHER): Payer: Medicare Other | Admitting: Family Medicine

## 2022-04-02 ENCOUNTER — Encounter: Payer: Self-pay | Admitting: Family Medicine

## 2022-04-02 VITALS — BP 142/90 | HR 83 | Temp 98.1°F | Resp 16 | Ht 68.0 in | Wt 177.0 lb

## 2022-04-02 DIAGNOSIS — E785 Hyperlipidemia, unspecified: Secondary | ICD-10-CM | POA: Diagnosis not present

## 2022-04-02 DIAGNOSIS — I10 Essential (primary) hypertension: Secondary | ICD-10-CM

## 2022-04-02 DIAGNOSIS — R350 Frequency of micturition: Secondary | ICD-10-CM | POA: Diagnosis not present

## 2022-04-02 DIAGNOSIS — N401 Enlarged prostate with lower urinary tract symptoms: Secondary | ICD-10-CM | POA: Diagnosis not present

## 2022-04-02 LAB — BASIC METABOLIC PANEL
BUN: 23 mg/dL (ref 6–23)
CO2: 25 mEq/L (ref 19–32)
Calcium: 10.1 mg/dL (ref 8.4–10.5)
Chloride: 106 mEq/L (ref 96–112)
Creatinine, Ser: 1.77 mg/dL — ABNORMAL HIGH (ref 0.40–1.50)
GFR: 35.33 mL/min — ABNORMAL LOW (ref >60.00)
Glucose, Bld: 94 mg/dL (ref 70–99)
Potassium: 4.4 mEq/L (ref 3.5–5.1)
Sodium: 140 mEq/L (ref 135–145)

## 2022-04-02 NOTE — Assessment & Plan Note (Signed)
Chronic issue. Stable. Continue proscar 5 mg daily and flomax 0.4 mg daily.

## 2022-04-02 NOTE — Assessment & Plan Note (Addendum)
Chronic issue. Borderline for age. He will start checking at home and follow-up with his new PCP in about 3 months. He will let me know what his readings are in 2-3 weeks. He will continue lisinopril 5 mg daily.

## 2022-04-02 NOTE — Patient Instructions (Signed)
Nice to see you. Please start checking your BP again. Please get me your readings in 2-3 weeks. You can call or send by mychart if you have access to that. Check your BP once daily when you have been at rest for 10-15 minutes.

## 2022-04-02 NOTE — Assessment & Plan Note (Signed)
Chronic issue. Well controlled. Continue simvastatin 20 mg daily.

## 2022-04-02 NOTE — Progress Notes (Signed)
Lucas Rumps, MD Phone: [phone]  Lucas Black is a 82 y.o. male who presents today for f/u.  HYPERTENSION Disease Monitoring Home BP Monitoring not checking recently as he just moved to [place] Chest pain- no    Dyspnea- no Medications Compliance-  taking lisinopril.   Edema- no BMET    Component Value Date/Time   NA 139 09/29/2021 0822   NA 137 05/30/2010 0000   K 4.3 09/29/2021 0822   CL 105 09/29/2021 0822   CO2 26 09/29/2021 0822   GLUCOSE 93 09/29/2021 0822   BUN 23 09/29/2021 0822   BUN 21 05/30/2010 0000   CREATININE 1.62 (H) 09/29/2021 0822   CALCIUM 9.9 09/29/2021 0822   HYPERLIPIDEMIA Symptoms Chest pain on exertion:  no   Medications: Compliance- taking simvastatin Right upper quadrant pain- no  Muscle aches- no Lipid Panel     Component Value Date/Time   CHOL 146 09/29/2021 0822   TRIG 111.0 09/29/2021 0822   HDL 45.90 09/29/2021 0822   CHOLHDL 3 09/29/2021 0822   VLDL 22.2 09/29/2021 0822   LDLCALC 78 09/29/2021 0822   BPH: Strain- no Frequency- no Urgency- yes Nocturia- 1-2x Emptying bladder- most of the time Medication- proscar, flomax, notes these are generally working ok for him   Social History   Tobacco Use  Smoking Status Former   Types: Cigarettes   Quit date: 03/31/1984   Years since quitting: 38.0  Smokeless Tobacco Never    Current Outpatient Medications on File Prior to Visit  Medication Sig Dispense Refill   aspirin 81 MG tablet Take 81 mg by mouth daily.     budesonide-formoterol (SYMBICORT) 80-4.5 MCG/ACT inhaler USE 2 INHALATIONS TWICE A DAY 30.6 g 3   finasteride (PROSCAR) 5 MG tablet Take 1 tablet (5 mg total) by mouth daily. 90 tablet 3   lisinopril (ZESTRIL) 10 MG tablet Take 0.5 tablets (5 mg total) by mouth daily. 45 tablet 3   montelukast (SINGULAIR) 10 MG tablet Take 1 tablet (10 mg total) by mouth at bedtime. 90 tablet 3   predniSONE (DELTASONE) 20 MG tablet Take 2 tablets (40 mg total) by mouth daily  with breakfast. 14 tablet 0   simvastatin (ZOCOR) 20 MG tablet Take 1 tablet (20 mg total) by mouth at bedtime. 90 tablet 3   tamsulosin (FLOMAX) 0.4 MG CAPS capsule Take 1 capsule (0.4 mg total) by mouth daily. 90 capsule 3   traZODone (DESYREL) 50 MG tablet TAKE ONE-HALF (1/2) TO ONE TABLET AT BEDTIME AS NEEDED FOR SLEEP 90 tablet 3   albuterol (VENTOLIN HFA) 108 (90 Base) MCG/ACT inhaler Inhale 2 puffs into the lungs every 6 (six) hours as needed for wheezing or shortness of breath. (Patient not taking: Reported on 04/02/2022) 8 g 0   doxycycline (VIBRA-TABS) 100 MG tablet Take 1 tablet (100 mg total) by mouth 2 (two) times daily. (Patient not taking: Reported on 04/02/2022) 14 tablet 0   No current facility-administered medications on file prior to visit.     ROS see history of present illness  Objective  Physical Exam Vitals:   04/02/22 0848 04/02/22 0905  BP: (!) 148/82 (!) 142/90  Pulse: 83   Resp: 16   Temp: 98.1 F (36.7 C)   SpO2: 93%     BP Readings from Last 3 Encounters:  04/02/22 (!) 142/90  09/29/21 (!) 150/84  05/19/21 (!) 155/88   Wt Readings from Last 3 Encounters:  04/02/22 177 lb (80.3 kg)  11/14/21 184 lb (83.5  kg)  09/29/21 184 lb 9.6 oz (83.7 kg)    Physical Exam Constitutional:      General: He is not in acute distress.    Appearance: He is not diaphoretic.  Cardiovascular:     Rate and Rhythm: Normal rate and regular rhythm.     Heart sounds: Normal heart sounds.  Pulmonary:     Effort: Pulmonary effort is normal.     Breath sounds: Normal breath sounds.  Skin:    General: Skin is warm and dry.  Neurological:     Mental Status: He is alert.      Assessment/Plan: Please see individual problem list.  Primary hypertension Assessment & Plan: Chronic issue. Borderline for age. He will start checking at home and follow-up with his new PCP in about 3 months. He will let me know what his readings are in 2-3 weeks. He will continue lisinopril 5  mg daily.   Orders: -     Basic metabolic panel  Hyperlipidemia, unspecified hyperlipidemia type Assessment & Plan: Chronic issue. Well controlled. Continue simvastatin 20 mg daily.    Benign prostatic hyperplasia with urinary frequency Assessment & Plan: Chronic issue. Stable. Continue proscar 5 mg daily and flomax 0.4 mg daily.        No follow-ups on file.   Lucas Rumps, MD [place]

## 2022-04-12 ENCOUNTER — Other Ambulatory Visit: Payer: Self-pay

## 2022-04-12 ENCOUNTER — Other Ambulatory Visit: Payer: Self-pay | Admitting: Family Medicine

## 2022-04-12 DIAGNOSIS — I10 Essential (primary) hypertension: Secondary | ICD-10-CM

## 2022-04-12 DIAGNOSIS — G479 Sleep disorder, unspecified: Secondary | ICD-10-CM

## 2022-04-12 DIAGNOSIS — E785 Hyperlipidemia, unspecified: Secondary | ICD-10-CM

## 2022-04-12 DIAGNOSIS — N401 Enlarged prostate with lower urinary tract symptoms: Secondary | ICD-10-CM

## 2022-04-12 DIAGNOSIS — J449 Chronic obstructive pulmonary disease, unspecified: Secondary | ICD-10-CM

## 2022-04-12 MED ORDER — BUDESONIDE-FORMOTEROL FUMARATE 80-4.5 MCG/ACT IN AERO: INHALATION_SPRAY | RESPIRATORY_TRACT | 3 refills | Status: AC

## 2022-04-12 MED ORDER — SIMVASTATIN 20 MG PO TABS: 20.0000 mg | ORAL_TABLET | Freq: Every day | ORAL | 3 refills | Status: AC

## 2022-04-12 MED ORDER — LISINOPRIL 10 MG PO TABS: 5.0000 mg | ORAL_TABLET | Freq: Every day | ORAL | 3 refills | Status: AC

## 2022-04-12 NOTE — Telephone Encounter (Signed)
Prescription Request  04/12/2022  Is this a "Controlled Substance" medicine? No  LOV: 04/02/2022  What is the name of the medication or equipment? finasteride (PROSCAR) 5 MG tablet, lisinopril (ZESTRIL) 10 MG tablet, simvastatin (ZOCOR) 20 MG tablet, and tamsulosin (FLOMAX) 0.4 MG CAPS capsule  Have you contacted your pharmacy to request a refill? Yes   Which pharmacy would you like this sent to?    [facility], [place] [address] Phone: [phone] Fax: [phone]    Patient notified that their request is being sent to the clinical staff for review and that they should receive a response within 2 business days.   Please advise at Mobile [phone] (mobile)

## 2022-04-13 MED ORDER — TRAZODONE HCL 50 MG PO TABS: ORAL_TABLET | ORAL | 3 refills | Status: AC

## 2022-04-13 NOTE — Telephone Encounter (Signed)
Sent to mail order pharmacy.

## 2022-05-03 ENCOUNTER — Other Ambulatory Visit: Payer: Self-pay | Admitting: Urology

## 2022-05-03 DIAGNOSIS — N401 Enlarged prostate with lower urinary tract symptoms: Secondary | ICD-10-CM

## 2022-05-23 ENCOUNTER — Encounter: Payer: Self-pay | Admitting: Urology

## 2022-05-23 ENCOUNTER — Ambulatory Visit (INDEPENDENT_AMBULATORY_CARE_PROVIDER_SITE_OTHER): Payer: Medicare Other | Admitting: Urology

## 2022-05-23 VITALS — BP 142/70 | HR 59 | Wt 177.0 lb

## 2022-05-23 DIAGNOSIS — R350 Frequency of micturition: Secondary | ICD-10-CM | POA: Diagnosis not present

## 2022-05-23 DIAGNOSIS — N401 Enlarged prostate with lower urinary tract symptoms: Secondary | ICD-10-CM

## 2022-05-23 LAB — BLADDER SCAN AMB NON-IMAGING: Scan Result: 114

## 2022-05-23 MED ORDER — FINASTERIDE 5 MG PO TABS: 5.0000 mg | ORAL_TABLET | Freq: Every day | ORAL | 3 refills | Status: AC

## 2022-05-23 NOTE — Progress Notes (Signed)
I, Lucas Black,acting as a scribe for Lucas Sons, MD.,have documented all relevant documentation on the behalf of Lucas Sons, MD,as directed by  Lucas Sons, MD while in the presence of Lucas Sons, MD.   05/23/22 9:15 AM   Lucas Black 01-Feb-1941 [id number]  Referring provider: Leone Haven, MD [address] [place],  [place] [address]  Chief Complaint  Patient presents with   Other    PVR    Urologic history: 1.  BPH with lower urinary tract symptoms -Outlet procedure 2004 for BPH; records not available -Recurrent symptoms around 2014 and started on combination therapy -Initially seen here 02/2017   HPI: 82 y.o. male presents for annual follow-up.  No problems since last years visit Stable LUTS on tamsulosin/finasteride Denies dysuria, gross hematuria Denies flank, abdominal or pelvic pain   PMH: Past Medical History:  Diagnosis Date   COPD (chronic obstructive pulmonary disease) ([facility]) 05/30/2011   COPD exacerbation ([facility]) 07/08/2015   Gastric ulcer 04/26/2011   1970's    Hyperlipidemia    Hypertension    Prostatic enlargement 11/30/2010   Screening for colon cancer 2011   colonoscopy normal   Seasonal allergies 06/24/2014    Surgical History: Past Surgical History:  Procedure Laterality Date   CATARACT EXTRACTION Left 2015   GASTRIC RESECTION  1973   secondary to ulcer   PROSTATE SURGERY      Home Medications:  Allergies as of 05/23/2022   No Known Allergies      Medication List        Accurate as of May 23, 2022  9:15 AM. If you have any questions, ask your nurse or doctor.          STOP taking these medications    albuterol 108 (90 Base) MCG/ACT inhaler Commonly known as: VENTOLIN HFA Stopped by: Lucas Sons, MD   doxycycline 100 MG tablet Commonly known as: VIBRA-TABS Stopped by: Lucas Sons, MD       TAKE these medications    aspirin 81 MG tablet Take 81 mg by mouth daily.    budesonide-formoterol 80-4.5 MCG/ACT inhaler Commonly known as: Symbicort USE 2 INHALATIONS TWICE A DAY   finasteride 5 MG tablet Commonly known as: PROSCAR Take 1 tablet (5 mg total) by mouth daily.   lisinopril 10 MG tablet Commonly known as: ZESTRIL Take 0.5 tablets (5 mg total) by mouth daily.   montelukast 10 MG tablet Commonly known as: SINGULAIR Take 1 tablet (10 mg total) by mouth at bedtime.   predniSONE 20 MG tablet Commonly known as: DELTASONE Take 2 tablets (40 mg total) by mouth daily with breakfast.   simvastatin 20 MG tablet Commonly known as: ZOCOR Take 1 tablet (20 mg total) by mouth at bedtime.   tamsulosin 0.4 MG Caps capsule Commonly known as: FLOMAX TAKE 1 CAPSULE DAILY   traZODone 50 MG tablet Commonly known as: DESYREL TAKE ONE-HALF (1/2) TO ONE TABLET AT BEDTIME AS NEEDED FOR SLEEP        Allergies: No Known Allergies  Family History: Family History  Problem Relation Age of Onset   Cancer Mother        colon cancer   Heart disease Father    Heart attack Brother    Colon cancer Brother    Cancer Brother        skin    Social History:  reports that he quit smoking about 38 years ago. His smoking use  included cigarettes. He has never used smokeless tobacco. He reports current alcohol use. He reports that he does not use drugs.   Physical Exam: BP (!) 142/70   Pulse (!) 59   Wt 177 lb (80.3 kg)   BMI 26.91 kg/m   Constitutional:  Alert and oriented, No acute distress. HEENT: [place] AT, moist mucus membranes.  Trachea midline, no masses. Cardiovascular: No clubbing, cyanosis, or edema. Respiratory: Normal respiratory effort, no increased work of breathing.  Assessment & Plan:    1. Benign prostatic hyperplasia with urinary frequency Stable voiding symptoms on tamsulosin/finasteride Refills sent to pharmacy Bladder scan PVR 114 mL Continue annual follow-up  I have reviewed the above documentation for accuracy and completeness,  and I agree with the above.   Lucas Black, [place] [address] [place], [place] [address] [phone]

## 2022-06-01 ENCOUNTER — Telehealth: Payer: Self-pay

## 2022-06-01 NOTE — Patient Outreach (Signed)
  Care Coordination   06/01/2022 Name: Lucas Black MRN: [id number] DOB: 05-03-1940   Care Coordination Outreach Attempts:  Successful telephone outreach to patient. Patient states he has moved out of state and will be changing providers.   Follow Up Plan:  No further outreach attempts will be made at this time. We have been unable to contact the patient to offer or enroll patient in care coordination services  Encounter Outcome:  Pt. Visit Completed   Care Coordination Interventions:  No, not indicated   Quinn Plowman [facility] [place] [phone] direct line

## 2022-10-09 DIAGNOSIS — J60 Coalworker's pneumoconiosis: Secondary | ICD-10-CM | POA: Diagnosis not present

## 2022-10-09 DIAGNOSIS — I10 Essential (primary) hypertension: Secondary | ICD-10-CM | POA: Diagnosis not present

## 2022-12-20 DIAGNOSIS — Z23 Encounter for immunization: Secondary | ICD-10-CM | POA: Diagnosis not present

## 2023-04-24 DIAGNOSIS — N400 Enlarged prostate without lower urinary tract symptoms: Secondary | ICD-10-CM | POA: Diagnosis not present

## 2023-04-24 DIAGNOSIS — Z0000 Encounter for general adult medical examination without abnormal findings: Secondary | ICD-10-CM | POA: Diagnosis not present

## 2023-04-24 DIAGNOSIS — I10 Essential (primary) hypertension: Secondary | ICD-10-CM | POA: Diagnosis not present

## 2023-04-24 DIAGNOSIS — E785 Hyperlipidemia, unspecified: Secondary | ICD-10-CM | POA: Diagnosis not present

## 2023-04-24 DIAGNOSIS — J60 Coalworker's pneumoconiosis: Secondary | ICD-10-CM | POA: Diagnosis not present

## 2023-05-23 ENCOUNTER — Ambulatory Visit: Payer: Self-pay | Admitting: Urology

## 2023-06-17 ENCOUNTER — Telehealth: Payer: Self-pay | Admitting: Family Medicine

## 2023-06-17 NOTE — Telephone Encounter (Signed)
 Lm:  Dr Lovetta Rucks has left the practic, if you wish to continue care at this office, please call and schedule a transfer of care to one of the following providers: Dr Jacklin Mascot, MD,  Tino Foreman or Tona Francis, NP.  E2C2 please schedule TOC

## 2023-10-25 DIAGNOSIS — J60 Coalworker's pneumoconiosis: Secondary | ICD-10-CM | POA: Diagnosis not present

## 2023-10-25 DIAGNOSIS — I10 Essential (primary) hypertension: Secondary | ICD-10-CM | POA: Diagnosis not present

## 2023-10-25 DIAGNOSIS — E785 Hyperlipidemia, unspecified: Secondary | ICD-10-CM | POA: Diagnosis not present

## 2023-10-25 DIAGNOSIS — N400 Enlarged prostate without lower urinary tract symptoms: Secondary | ICD-10-CM | POA: Diagnosis not present

## 2023-11-25 DIAGNOSIS — Z23 Encounter for immunization: Secondary | ICD-10-CM | POA: Diagnosis not present
